# Patient Record
Sex: Male | Born: 1975 | Race: Black or African American | Hispanic: No | Marital: Married | Smoking: Never smoker
Health system: Southern US, Community
[De-identification: ages and names within clinical notes are randomized; demographics above are authoritative.]

## PROBLEM LIST (undated history)

## (undated) DIAGNOSIS — A150 Tuberculosis of lung: Secondary | ICD-10-CM

---

## 2013-12-24 ENCOUNTER — Ambulatory Visit
Admission: RE | Admit: 2013-12-24 | Discharge: 2013-12-24 | Disposition: A | Discharge status: Home / Self Care | Payer: No Typology Code available for payment source | Source: Ambulatory Visit | Attending: Infectious Disease | Admitting: Infectious Disease

## 2013-12-24 ENCOUNTER — Other Ambulatory Visit: Payer: Self-pay | Admitting: Infectious Disease

## 2013-12-24 DIAGNOSIS — R7611 Nonspecific reaction to tuberculin skin test without active tuberculosis: Secondary | ICD-10-CM

## 2014-02-01 ENCOUNTER — Emergency Department (HOSPITAL_COMMUNITY): Payer: No Typology Code available for payment source

## 2014-02-01 ENCOUNTER — Encounter (HOSPITAL_COMMUNITY): Payer: Self-pay | Admitting: Emergency Medicine

## 2014-02-01 ENCOUNTER — Emergency Department (HOSPITAL_COMMUNITY)
Admission: EM | Admit: 2014-02-01 | Discharge: 2014-02-01 | Disposition: A | Discharge status: Home / Self Care | Payer: No Typology Code available for payment source | Attending: Emergency Medicine | Admitting: Emergency Medicine

## 2014-02-01 DIAGNOSIS — Z8611 Personal history of tuberculosis: Secondary | ICD-10-CM | POA: Insufficient documentation

## 2014-02-01 DIAGNOSIS — Z79899 Other long term (current) drug therapy: Secondary | ICD-10-CM | POA: Insufficient documentation

## 2014-02-01 DIAGNOSIS — S161XXA Strain of muscle, fascia and tendon at neck level, initial encounter: Secondary | ICD-10-CM

## 2014-02-01 DIAGNOSIS — S139XXA Sprain of joints and ligaments of unspecified parts of neck, initial encounter: Secondary | ICD-10-CM | POA: Insufficient documentation

## 2014-02-01 DIAGNOSIS — Y92410 Unspecified street and highway as the place of occurrence of the external cause: Secondary | ICD-10-CM | POA: Insufficient documentation

## 2014-02-01 DIAGNOSIS — Y9389 Activity, other specified: Secondary | ICD-10-CM | POA: Insufficient documentation

## 2014-02-01 HISTORY — DX: Tuberculosis of lung: A15.0

## 2014-02-01 MED ORDER — CYCLOBENZAPRINE HCL 5 MG PO TABS: 5.0000 mg | ORAL_TABLET | Freq: Two times a day (BID) | ORAL | Status: DC | PRN

## 2014-02-01 MED ORDER — HYDROCODONE-ACETAMINOPHEN 5-325 MG PO TABS: 1.0000 | ORAL_TABLET | ORAL | Status: DC | PRN

## 2014-02-01 NOTE — Discharge Instructions (Signed)
Cervical Sprain A cervical sprain is an injury in the neck in which the strong, fibrous tissues (ligaments) that connect your neck bones stretch or tear. Cervical sprains can range from mild to severe. Severe cervical sprains can cause the neck vertebrae to be unstable. This can lead to damage of the spinal cord and can result in serious nervous system problems. The amount of time it takes for a cervical sprain to get better depends on the cause and extent of the injury. Most cervical sprains heal in 1 to 3 weeks. CAUSES  Severe cervical sprains may be caused by:   Contact sport injuries (such as from football, rugby, wrestling, hockey, auto racing, gymnastics, diving, martial arts, or boxing).   Motor vehicle collisions.   Whiplash injuries. This is an injury from a sudden forward-and backward whipping movement of the head and neck.  Falls.  Mild cervical sprains may be caused by:   Being in an awkward position, such as while cradling a telephone between your ear and shoulder.   Sitting in a chair that does not offer proper support.   Working at a poorly designed computer station.   Looking up or down for long periods of time.  SYMPTOMS   Pain, soreness, stiffness, or a burning sensation in the front, back, or sides of the neck. This discomfort may develop immediately after the injury or slowly, 24 hours or more after the injury.   Pain or tenderness directly in the middle of the back of the neck.   Shoulder or upper back pain.   Limited ability to move the neck.   Headache.   Dizziness.   Weakness, numbness, or tingling in the hands or arms.   Muscle spasms.   Difficulty swallowing or chewing.   Tenderness and swelling of the neck.  DIAGNOSIS  Most of the time your health care provider can diagnose a cervical sprain by taking your history and doing a physical exam. Your health care provider will ask about previous neck injuries and any known neck  problems, such as arthritis in the neck. X-rays may be taken to find out if there are any other problems, such as with the bones of the neck. Other tests, such as a CT scan or MRI, may also be needed.  TREATMENT  Treatment depends on the severity of the cervical sprain. Mild sprains can be treated with rest, keeping the neck in place (immobilization), and pain medicines. Severe cervical sprains are immediately immobilized. Further treatment is done to help with pain, muscle spasms, and other symptoms and may include:  Medicines, such as pain relievers, numbing medicines, or muscle relaxants.   Physical therapy. This may involve stretching exercises, strengthening exercises, and posture training. Exercises and improved posture can help stabilize the neck, strengthen muscles, and help stop symptoms from returning.  HOME CARE INSTRUCTIONS   Put ice on the injured area.   Put ice in a plastic bag.   Place a towel between your skin and the bag.   Leave the ice on for 15 20 minutes, 3 4 times a day.   If your injury was severe, you may have been given a cervical collar to wear. A cervical collar is a two-piece collar designed to keep your neck from moving while it heals.  Do not remove the collar unless instructed by your health care provider.  If you have long hair, keep it outside of the collar.  Ask your health care provider before making any adjustments to your collar.   Minor adjustments may be required over time to improve comfort and reduce pressure on your chin or on the back of your head.  Ifyou are allowed to remove the collar for cleaning or bathing, follow your health care provider's instructions on how to do so safely.  Keep your collar clean by wiping it with mild soap and water and drying it completely. If the collar you have been given includes removable pads, remove them every 1 2 days and hand wash them with soap and water. Allow them to air dry. They should be completely  dry before you wear them in the collar.  If you are allowed to remove the collar for cleaning and bathing, wash and dry the skin of your neck. Check your skin for irritation or sores. If you see any, tell your health care provider.  Do not drive while wearing the collar.   Only take over-the-counter or prescription medicines for pain, discomfort, or fever as directed by your health care provider.   Keep all follow-up appointments as directed by your health care provider.   Keep all physical therapy appointments as directed by your health care provider.   Make any needed adjustments to your workstation to promote good posture.   Avoid positions and activities that make your symptoms worse.   Warm up and stretch before being active to help prevent problems.  SEEK MEDICAL CARE IF:   Your pain is not controlled with medicine.   You are unable to decrease your pain medicine over time as planned.   Your activity level is not improving as expected.  SEEK IMMEDIATE MEDICAL CARE IF:   You develop any bleeding.  You develop stomach upset.  You have signs of an allergic reaction to your medicine.   Your symptoms get worse.   You develop new, unexplained symptoms.   You have numbness, tingling, weakness, or paralysis in any part of your body.  MAKE SURE YOU:   Understand these instructions.  Will watch your condition.  Will get help right away if you are not doing well or get worse. Document Released: 08/18/2007 Document Revised: 08/11/2013 Document Reviewed: 04/28/2013 [employer] Patient Information 2014 [employer].  
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
## 2014-02-04 NOTE — ED Provider Notes (Signed)
Medical screening examination/treatment/procedure(s) were performed by non-physician practitioner and as supervising physician I was immediately available for consultation/collaboration.   EKG Interpretation None        Lela Gell, MD 02/04/14 1600 

## 2014-10-04 ENCOUNTER — Ambulatory Visit (INDEPENDENT_AMBULATORY_CARE_PROVIDER_SITE_OTHER): Payer: 59 | Admitting: Medical

## 2014-10-04 VITALS — BP 100/70 | HR 60 | Temp 98.0°F | Resp 16 | Wt 149.0 lb

## 2014-10-04 DIAGNOSIS — R599 Enlarged lymph nodes, unspecified: Secondary | ICD-10-CM

## 2014-10-04 DIAGNOSIS — R590 Localized enlarged lymph nodes: Secondary | ICD-10-CM

## 2014-10-04 DIAGNOSIS — N481 Balanitis: Secondary | ICD-10-CM

## 2014-10-04 DIAGNOSIS — R21 Rash and other nonspecific skin eruption: Secondary | ICD-10-CM

## 2014-10-04 DIAGNOSIS — N489 Disorder of penis, unspecified: Secondary | ICD-10-CM

## 2014-10-04 MED ORDER — KETOCONAZOLE 2 % EX CREA: TOPICAL_CREAM | CUTANEOUS | Status: DC

## 2014-10-04 MED ORDER — HYDROCORTISONE 0.5 % EX CREA: TOPICAL_CREAM | CUTANEOUS | Status: DC

## 2014-10-04 MED ORDER — FLUCONAZOLE 100 MG PO TABS: 100.0000 mg | ORAL_TABLET | Freq: Every day | ORAL | Status: DC

## 2014-10-04 NOTE — Progress Notes (Signed)
Subjective: Here as a new patient today.  [place]  He has no significant health history.  [place] [facility] [place] [facility] [place] [facility] [place]  He notes having similar rash on the head of his penis couple times in the past including September 2013, was treated at that time with topical and oral fungal medicine that eventually resolved the rash.  The rash has been going on for the last few months, he went to the health department had reportedly negative STD screening, and was put on terconazole topical ointment which she says is not helping. He has been using this for over a month now. He denies any sweating or moisture in the genital region regularly.  He is circumcised.  He denies any urinary complaints, no testicular pain or lump, no penile discharge, no testicular pain or swelling, no abdominal or back pain related to this. 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 Denies history of STD, uses condoms. No other aggravating or relieving factors. No other complaint.  Review of systems as in subjective  Objective: Gen.: Well-developed, well-nourished, no acute distress There is several mildly swollen tender lymph nodes in the right inguinal region Dorsal glands of penis with several patches of pink red rash, flat, no vesicles, no other papules or other lesions, circumcised, no testicular mass or other lesion, no discharge, no other deformity Skin no other rash or lesions in the genital region   Assessment: Encounter Diagnoses  Name Primary?  . Balanitis Yes  . Rash of penis   . Inguinal lymphadenopathy    Plan: We'll request lab results from the health department but they were reportedly normal.  Given his prior success with oral plus topical agent, begin topical ketoconazole and low-dose hydrocortisone topical creams, begin Diflucan oral 1 week, discussed timeframe in which symptoms should  resolve. Advise recheck in 2 weeks. If not improving and that time consider dermatology referral or other baseline labs to rule out underlying process related to the poor healing

## 2015-04-08 ENCOUNTER — Other Ambulatory Visit: Payer: Self-pay | Admitting: Medical

## 2015-07-07 ENCOUNTER — Emergency Department (HOSPITAL_COMMUNITY)
Admission: EM | Admit: 2015-07-07 | Discharge: 2015-07-07 | Disposition: A | Discharge status: Home / Self Care | Payer: Self-pay | Attending: Emergency Medicine | Admitting: Emergency Medicine

## 2015-07-07 ENCOUNTER — Encounter (HOSPITAL_COMMUNITY): Payer: Self-pay | Admitting: *Deleted

## 2015-07-07 DIAGNOSIS — Y92410 Unspecified street and highway as the place of occurrence of the external cause: Secondary | ICD-10-CM | POA: Insufficient documentation

## 2015-07-07 DIAGNOSIS — Z7952 Long term (current) use of systemic steroids: Secondary | ICD-10-CM | POA: Insufficient documentation

## 2015-07-07 DIAGNOSIS — Z79899 Other long term (current) drug therapy: Secondary | ICD-10-CM | POA: Insufficient documentation

## 2015-07-07 DIAGNOSIS — S161XXA Strain of muscle, fascia and tendon at neck level, initial encounter: Secondary | ICD-10-CM | POA: Insufficient documentation

## 2015-07-07 DIAGNOSIS — Y9389 Activity, other specified: Secondary | ICD-10-CM | POA: Insufficient documentation

## 2015-07-07 DIAGNOSIS — Y999 Unspecified external cause status: Secondary | ICD-10-CM | POA: Insufficient documentation

## 2015-07-07 DIAGNOSIS — S299XXA Unspecified injury of thorax, initial encounter: Secondary | ICD-10-CM | POA: Insufficient documentation

## 2015-07-07 DIAGNOSIS — Z8611 Personal history of tuberculosis: Secondary | ICD-10-CM | POA: Insufficient documentation

## 2015-07-07 MED ORDER — METHOCARBAMOL 500 MG PO TABS: 1000.0000 mg | ORAL_TABLET | Freq: Four times a day (QID) | ORAL | Status: DC
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
## 2015-07-07 NOTE — ED Notes (Signed)
Pt stable, ambulatory, states understanding of discharge instructions 

## 2015-08-21 ENCOUNTER — Encounter (HOSPITAL_COMMUNITY): Payer: Self-pay | Admitting: Emergency Medicine

## 2015-08-21 ENCOUNTER — Emergency Department (HOSPITAL_COMMUNITY)
Admission: EM | Admit: 2015-08-21 | Discharge: 2015-08-21 | Disposition: A | Discharge status: Home / Self Care | Payer: Self-pay | Attending: Emergency Medicine | Admitting: Emergency Medicine

## 2015-08-21 DIAGNOSIS — Z791 Long term (current) use of non-steroidal anti-inflammatories (NSAID): Secondary | ICD-10-CM | POA: Insufficient documentation

## 2015-08-21 DIAGNOSIS — Z8611 Personal history of tuberculosis: Secondary | ICD-10-CM | POA: Insufficient documentation

## 2015-08-21 DIAGNOSIS — M542 Cervicalgia: Secondary | ICD-10-CM | POA: Insufficient documentation

## 2015-08-21 DIAGNOSIS — Z7952 Long term (current) use of systemic steroids: Secondary | ICD-10-CM | POA: Insufficient documentation

## 2015-08-21 DIAGNOSIS — Z79899 Other long term (current) drug therapy: Secondary | ICD-10-CM | POA: Insufficient documentation

## 2015-08-21 MED ORDER — IBUPROFEN 400 MG PO TABS: 800.0000 mg | ORAL_TABLET | Freq: Once | ORAL | Status: DC

## 2015-08-21 MED ORDER — IBUPROFEN 800 MG PO TABS: 800.0000 mg | ORAL_TABLET | Freq: Three times a day (TID) | ORAL | Status: DC
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
## 2015-08-21 NOTE — Discharge Instructions (Signed)
Cervical Strain and Sprain With Rehab Follow up with a primary care physician. Take ibuprofen for pain. Return for dizziness. Cervical strain and sprain are injuries that commonly occur with "whiplash" injuries. Whiplash occurs when the neck is forcefully whipped backward or forward, such as during a motor vehicle accident or during contact sports. The muscles, ligaments, tendons, discs, and nerves of the neck are susceptible to injury when this occurs. RISK FACTORS Risk of having a whiplash injury increases if:  Osteoarthritis of the spine.  Situations that make head or neck accidents or trauma more likely.  High-risk sports (football, rugby, wrestling, hockey, auto racing, gymnastics, diving, contact karate, or boxing).  Poor strength and flexibility of the neck.  Previous neck injury.  Poor tackling technique.  Improperly fitted or padded equipment. SYMPTOMS   Pain or stiffness in the front or back of neck or both.  Symptoms may present immediately or up to 24 hours after injury.  Dizziness, headache, nausea, and vomiting.  Muscle spasm with soreness and stiffness in the neck.  Tenderness and swelling at the injury site. PREVENTION  Learn and use proper technique (avoid tackling with the head, spearing, and head-butting; use proper falling techniques to avoid landing on the head).  Warm up and stretch properly before activity.  Maintain physical fitness:  Strength, flexibility, and endurance.  Cardiovascular fitness.  Wear properly fitted and padded protective equipment, such as padded soft collars, for participation in contact sports. PROGNOSIS  Recovery from cervical strain and sprain injuries is dependent on the extent of the injury. These injuries are usually curable in 1 week to 3 months with appropriate treatment.  RELATED COMPLICATIONS   Temporary numbness and weakness may occur if the nerve roots are damaged, and this may persist until the nerve has  completely healed.  Chronic pain due to frequent recurrence of symptoms.  Prolonged healing, especially if activity is resumed too soon (before complete recovery). TREATMENT  Treatment initially involves the use of ice and medication to help reduce pain and inflammation. It is also important to perform strengthening and stretching exercises and modify activities that worsen symptoms so the injury does not get worse. These exercises may be performed at home or with a therapist. For patients who experience severe symptoms, a soft, padded collar may be recommended to be worn around the neck.  Improving your posture may help reduce symptoms. Posture improvement includes pulling your chin and abdomen in while sitting or standing. If you are sitting, sit in a firm chair with your buttocks against the back of the chair. While sleeping, try replacing your pillow with a small towel rolled to 2 inches in diameter, or use a cervical pillow or soft cervical collar. Poor sleeping positions delay healing.  For patients with nerve root damage, which causes numbness or weakness, the use of a cervical traction apparatus may be recommended. Surgery is rarely necessary for these injuries. However, cervical strain and sprains that are present at birth (congenital) may require surgery. MEDICATION   If pain medication is necessary, nonsteroidal anti-inflammatory medications, such as aspirin and ibuprofen, or other minor pain relievers, such as acetaminophen, are often recommended.  Do not take pain medication for 7 days before surgery.  Prescription pain relievers may be given if deemed necessary by your caregiver. Use only as directed and only as much as you need. HEAT AND COLD:   Cold treatment (icing) relieves pain and reduces inflammation. Cold treatment should be applied for 10 to 15 minutes every 2 to  3 hours for inflammation and pain and immediately after any activity that aggravates your symptoms. Use ice packs  or an ice massage.  Heat treatment may be used prior to performing the stretching and strengthening activities prescribed by your caregiver, physical therapist, or athletic trainer. Use a heat pack or a warm soak. SEEK MEDICAL CARE IF:   Symptoms get worse or do not improve in 2 weeks despite treatment.  New, unexplained symptoms develop (drugs used in treatment may produce side effects). EXERCISES RANGE OF MOTION (ROM) AND STRETCHING EXERCISES - Cervical Strain and Sprain These exercises may help you when beginning to rehabilitate your injury. In order to successfully resolve your symptoms, you must improve your posture. These exercises are designed to help reduce the forward-head and rounded-shoulder posture which contributes to this condition. Your symptoms may resolve with or without further involvement from your physician, physical therapist or athletic trainer. While completing these exercises, remember:   Restoring tissue flexibility helps normal motion to return to the joints. This allows healthier, less painful movement and activity.  An effective stretch should be held for at least 20 seconds, although you may need to begin with shorter hold times for comfort.  A stretch should never be painful. You should only feel a gentle lengthening or release in the stretched tissue. STRETCH- Axial Extensors  Lie on your back on the floor. You may bend your knees for comfort. Place a rolled-up hand towel or dish towel, about 2 inches in diameter, under the part of your head that makes contact with the floor.  Gently tuck your chin, as if trying to make a "double chin," until you feel a gentle stretch at the base of your head.  Hold __________ seconds. Repeat __________ times. Complete this exercise __________ times per day.  STRETCH - Axial Extension   Stand or sit on a firm surface. Assume a good posture: chest up, shoulders drawn back, abdominal muscles slightly tense, knees unlocked (if  standing) and feet hip width apart.  Slowly retract your chin so your head slides back and your chin slightly lowers. Continue to look straight ahead.  You should feel a gentle stretch in the back of your head. Be certain not to feel an aggressive stretch since this can cause headaches later.  Hold for __________ seconds. Repeat __________ times. Complete this exercise __________ times per day. STRETCH - Cervical Side Bend   Stand or sit on a firm surface. Assume a good posture: chest up, shoulders drawn back, abdominal muscles slightly tense, knees unlocked (if standing) and feet hip width apart.  Without letting your nose or shoulders move, slowly tip your right / left ear to your shoulder until your feel a gentle stretch in the muscles on the opposite side of your neck.  Hold __________ seconds. Repeat __________ times. Complete this exercise __________ times per day. STRETCH - Cervical Rotators   Stand or sit on a firm surface. Assume a good posture: chest up, shoulders drawn back, abdominal muscles slightly tense, knees unlocked (if standing) and feet hip width apart.  Keeping your eyes level with the ground, slowly turn your head until you feel a gentle stretch along the back and opposite side of your neck.  Hold __________ seconds. Repeat __________ times. Complete this exercise __________ times per day. RANGE OF MOTION - Neck Circles   Stand or sit on a firm surface. Assume a good posture: chest up, shoulders drawn back, abdominal muscles slightly tense, knees unlocked (if standing) and feet  hip width apart.  Gently roll your head down and around from the back of one shoulder to the back of the other. The motion should never be forced or painful.  Repeat the motion 10-20 times, or until you feel the neck muscles relax and loosen. Repeat __________ times. Complete the exercise __________ times per day. STRENGTHENING EXERCISES - Cervical Strain and Sprain These exercises may  help you when beginning to rehabilitate your injury. They may resolve your symptoms with or without further involvement from your physician, physical therapist, or athletic trainer. While completing these exercises, remember:   Muscles can gain both the endurance and the strength needed for everyday activities through controlled exercises.  Complete these exercises as instructed by your physician, physical therapist, or athletic trainer. Progress the resistance and repetitions only as guided.  You may experience muscle soreness or fatigue, but the pain or discomfort you are trying to eliminate should never worsen during these exercises. If this pain does worsen, stop and make certain you are following the directions exactly. If the pain is still present after adjustments, discontinue the exercise until you can discuss the trouble with your clinician. STRENGTH - Cervical Flexors, Isometric  Face a wall, standing about 6 inches away. Place a small pillow, a ball about 6-8 inches in diameter, or a folded towel between your forehead and the wall.  Slightly tuck your chin and gently push your forehead into the soft object. Push only with mild to moderate intensity, building up tension gradually. Keep your jaw and forehead relaxed.  Hold 10 to 20 seconds. Keep your breathing relaxed.  Release the tension slowly. Relax your neck muscles completely before you start the next repetition. Repeat __________ times. Complete this exercise __________ times per day. STRENGTH- Cervical Lateral Flexors, Isometric   Stand about 6 inches away from a wall. Place a small pillow, a ball about 6-8 inches in diameter, or a folded towel between the side of your head and the wall.  Slightly tuck your chin and gently tilt your head into the soft object. Push only with mild to moderate intensity, building up tension gradually. Keep your jaw and forehead relaxed.  Hold 10 to 20 seconds. Keep your breathing  relaxed.  Release the tension slowly. Relax your neck muscles completely before you start the next repetition. Repeat __________ times. Complete this exercise __________ times per day. STRENGTH - Cervical Extensors, Isometric   Stand about 6 inches away from a wall. Place a small pillow, a ball about 6-8 inches in diameter, or a folded towel between the back of your head and the wall.  Slightly tuck your chin and gently tilt your head back into the soft object. Push only with mild to moderate intensity, building up tension gradually. Keep your jaw and forehead relaxed.  Hold 10 to 20 seconds. Keep your breathing relaxed.  Release the tension slowly. Relax your neck muscles completely before you start the next repetition. Repeat __________ times. Complete this exercise __________ times per day. POSTURE AND BODY MECHANICS CONSIDERATIONS - Cervical Strain and Sprain Keeping correct posture when sitting, standing or completing your activities will reduce the stress put on different body tissues, allowing injured tissues a chance to heal and limiting painful experiences. The following are general guidelines for improved posture. Your physician or physical therapist will provide you with any instructions specific to your needs. While reading these guidelines, remember:  The exercises prescribed by your provider will help you have the flexibility and strength to maintain correct  postures.  The correct posture provides the optimal environment for your joints to work. All of your joints have less wear and tear when properly supported by a spine with good posture. This means you will experience a healthier, less painful body.  Correct posture must be practiced with all of your activities, especially prolonged sitting and standing. Correct posture is as important when doing repetitive low-stress activities (typing) as it is when doing a single heavy-load activity (lifting). PROLONGED STANDING WHILE  SLIGHTLY LEANING FORWARD When completing a task that requires you to lean forward while standing in one place for a long time, place either foot up on a stationary 2- to 4-inch high object to help maintain the best posture. When both feet are on the ground, the low back tends to lose its slight inward curve. If this curve flattens (or becomes too large), then the back and your other joints will experience too much stress, fatigue more quickly, and can cause pain.  RESTING POSITIONS Consider which positions are most painful for you when choosing a resting position. If you have pain with flexion-based activities (sitting, bending, stooping, squatting), choose a position that allows you to rest in a less flexed posture. You would want to avoid curling into a fetal position on your side. If your pain worsens with extension-based activities (prolonged standing, working overhead), avoid resting in an extended position such as sleeping on your stomach. Most people will find more comfort when they rest with their spine in a more neutral position, neither too rounded nor too arched. Lying on a non-sagging bed on your side with a pillow between your knees, or on your back with a pillow under your knees will often provide some relief. Keep in mind, being in any one position for a prolonged period of time, no matter how correct your posture, can still lead to stiffness. WALKING Walk with an upright posture. Your ears, shoulders, and hips should all line up. OFFICE WORK When working at a desk, create an environment that supports good, upright posture. Without extra support, muscles fatigue and lead to excessive strain on joints and other tissues. CHAIR:  A chair should be able to slide under your desk when your back makes contact with the back of the chair. This allows you to work closely.  The chair's height should allow your eyes to be level with the upper part of your monitor and your hands to be slightly lower  than your elbows.  Body position:  Your feet should make contact with the floor. If this is not possible, use a foot rest.  Keep your ears over your shoulders. This will reduce stress on your neck and low back.   This information is not intended to replace advice given to you by your health care provider. Make sure you discuss any questions you have with your health care provider.   Document Released: 10/21/2005 Document Revised: 11/11/2014 Document Reviewed: 02/02/2009 Else[employer] Interactive Patient Education [employer]

## 2016-01-03 IMAGING — CR DG CHEST 1V
1 series · 1 of 1 positions shown · non-contrast
Comparison: None.

CLINICAL DATA: Positive tuberculin skin test

EXAM:
CHEST - 1 VIEW

[view not recorded]
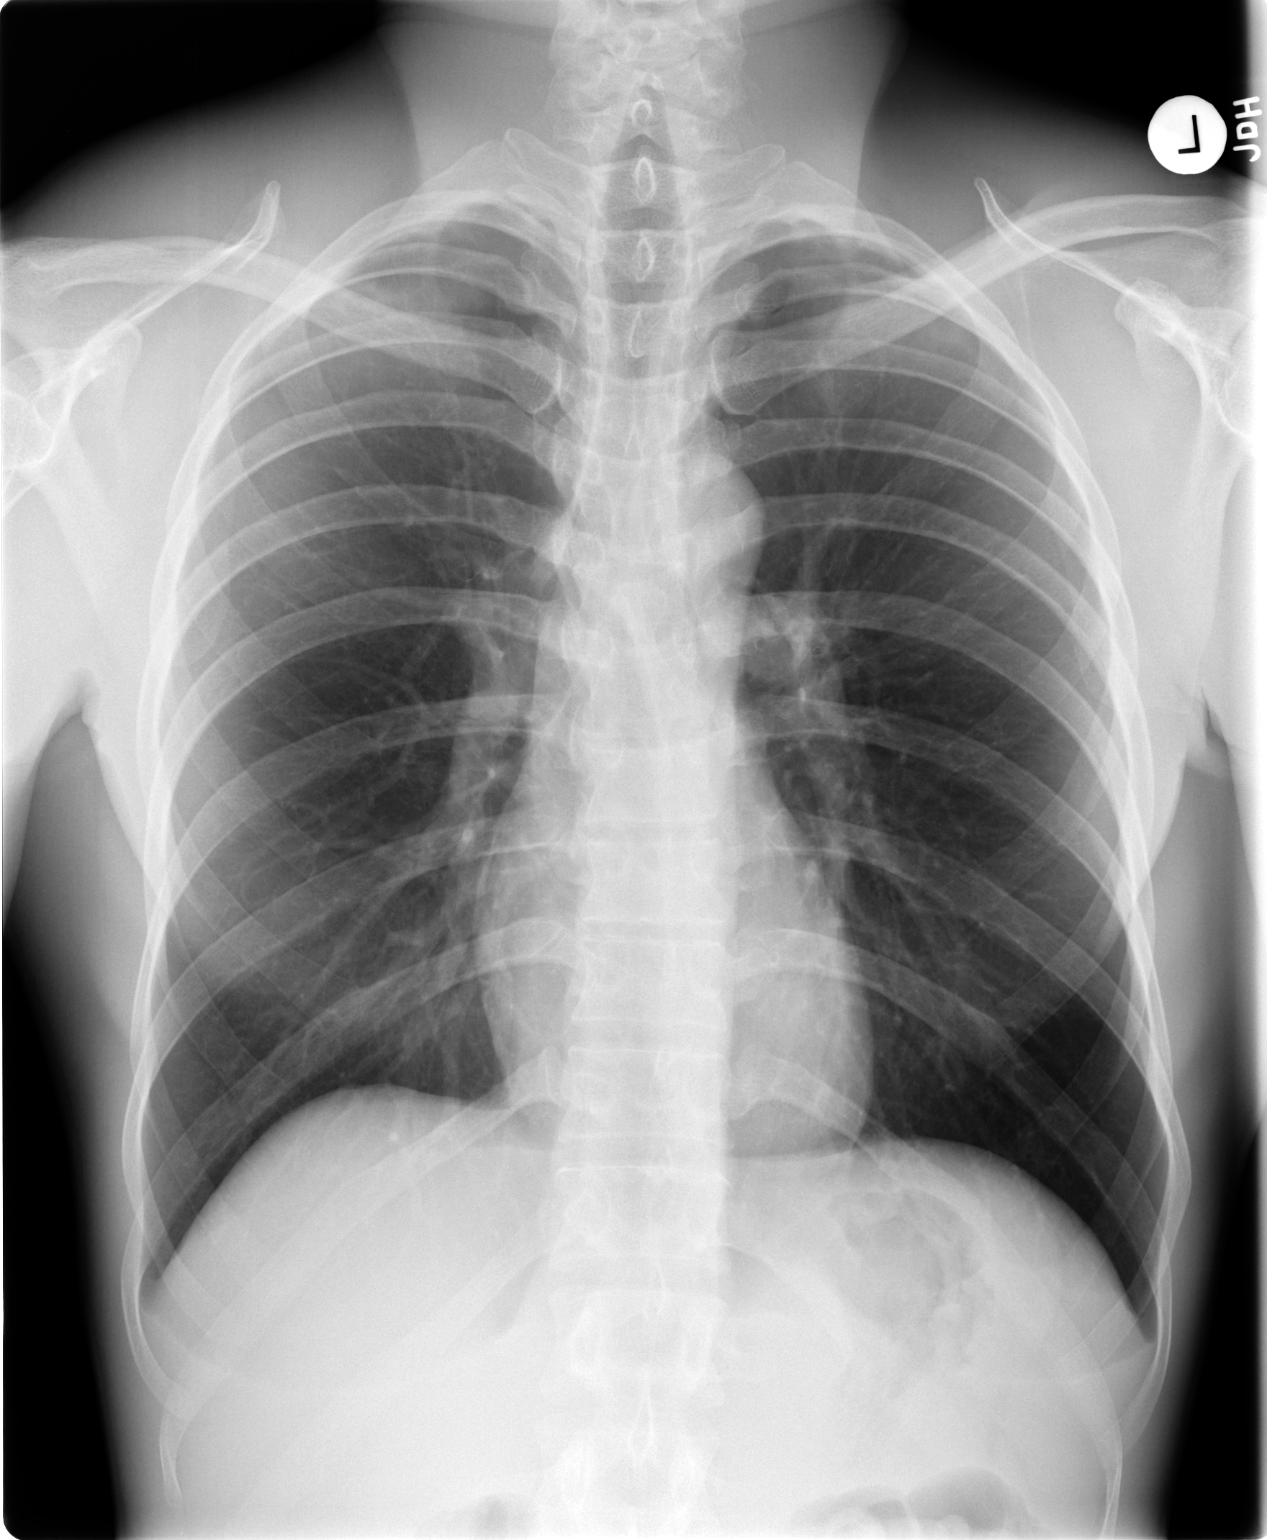

[1 of 1 positions shown; findings below may reference images not displayed]

FINDINGS: Lungs are clear. Heart size and pulmonary vascularity are normal. No
adenopathy. No bone lesions.
IMPRESSION: No abnormality noted.

## 2016-02-12 ENCOUNTER — Encounter (HOSPITAL_COMMUNITY): Payer: Self-pay | Admitting: Emergency Medicine

## 2016-02-12 ENCOUNTER — Emergency Department (HOSPITAL_COMMUNITY)
Admission: EM | Admit: 2016-02-12 | Discharge: 2016-02-12 | Disposition: A | Discharge status: Home / Self Care | Payer: Managed Care, Other (non HMO) | Attending: Emergency Medicine | Admitting: Emergency Medicine

## 2016-02-12 DIAGNOSIS — Z8611 Personal history of tuberculosis: Secondary | ICD-10-CM | POA: Diagnosis not present

## 2016-02-12 DIAGNOSIS — Z79899 Other long term (current) drug therapy: Secondary | ICD-10-CM | POA: Diagnosis not present

## 2016-02-12 DIAGNOSIS — L0231 Cutaneous abscess of buttock: Secondary | ICD-10-CM

## 2016-02-12 MED ORDER — LIDOCAINE-EPINEPHRINE (PF) 2 %-1:200000 IJ SOLN
10.0000 mL | Freq: Once | INTRAMUSCULAR | Status: AC
  Administered 2016-02-12: 10 mL
  Filled 2016-02-12: qty 20

## 2016-02-12 NOTE — Discharge Instructions (Signed)
Abscess °An abscess is an infected area that contains a collection of pus and debris. It can occur in almost any part of the body. An abscess is also known as a furuncle or boil. °CAUSES  °An abscess occurs when tissue gets infected. This can occur from blockage of oil or sweat glands, infection of hair follicles, or a minor injury to the skin. As the body tries to fight the infection, pus collects in the area and creates pressure under the skin. This pressure causes pain. People with weakened immune systems have difficulty fighting infections and get certain abscesses more often.  °SYMPTOMS °Usually an abscess develops on the skin and becomes a painful mass that is red, warm, and tender. If the abscess forms under the skin, you may feel a moveable soft area under the skin. Some abscesses break open (rupture) on their own, but most will continue to get worse without care. The infection can spread deeper into the body and eventually into the bloodstream, causing you to feel ill.  °DIAGNOSIS  °Your caregiver will take your medical history and perform a physical exam. A sample of fluid may also be taken from the abscess to determine what is causing your infection. °TREATMENT  °Your caregiver may prescribe antibiotic medicines to fight the infection. However, taking antibiotics alone usually does not cure an abscess. Your caregiver may need to make a small cut (incision) in the abscess to drain the pus. In some cases, gauze is packed into the abscess to reduce pain and to continue draining the area. °HOME CARE INSTRUCTIONS  °· Only take over-the-counter or prescription medicines for pain, discomfort, or fever as directed by your caregiver. °· If you were prescribed antibiotics, take them as directed. Finish them even if you start to feel better. °· If gauze is used, follow your caregiver's directions for changing the gauze. °· To avoid spreading the infection: °· Keep your draining abscess covered with a  bandage. °· Wash your hands well. °· Do not share personal care items, towels, or whirlpools with others. °· Avoid skin contact with others. °· Keep your skin and clothes clean around the abscess. °· Keep all follow-up appointments as directed by your caregiver. °SEEK MEDICAL CARE IF:  °· You have increased pain, swelling, redness, fluid drainage, or bleeding. °· You have muscle aches, chills, or a general ill feeling. °· You have a fever. °MAKE SURE YOU:  °· Understand these instructions. °· Will watch your condition. °· Will get help right away if you are not doing well or get worse. °  °This information is not intended to replace advice given to you by your health care provider. Make sure you discuss any questions you have with your health care provider. °  °Document Released: 07/31/2005 Document Revised: 04/21/2012 Document Reviewed: 01/03/2012 °Elsevier Interactive Patient Education ©2016 [employer] ° °Incision and Drainage °Incision and drainage is a procedure in which a sac-like structure (cystic structure) is opened and drained. The area to be drained usually contains material such as pus, fluid, or blood.  °LET YOUR CAREGIVER KNOW ABOUT:  °· Allergies to medicine. °· Medicines taken, including vitamins, herbs, eyedrops, over-the-counter medicines, and creams. °· Use of steroids (by mouth or creams). °· Previous problems with anesthetics or numbing medicines. °· History of bleeding problems or blood clots. °· Previous surgery. °· Other health problems, including diabetes and kidney problems. °· Possibility of pregnancy, if this applies. °RISKS AND COMPLICATIONS °· Pain. °· Bleeding. °· Scarring. °· Infection. °BEFORE THE PROCEDURE  °  You may need to have an ultrasound or other imaging tests to see how large or deep your cystic structure is. Blood tests may also be used to determine if you have an infection or how severe the infection is. You may need to have a tetanus shot. PROCEDURE  The affected area  is cleaned with a cleaning fluid. The cyst area will then be numbed with a medicine (local anesthetic). A small incision will be made in the cystic structure. A syringe or catheter may be used to drain the contents of the cystic structure, or the contents may be squeezed out. The area will then be flushed with a cleansing solution. After cleansing the area, it is often gently packed with a gauze or another wound dressing. Once it is packed, it will be covered with gauze and tape or some other type of wound dressing. AFTER THE PROCEDURE   Often, you will be allowed to go home right after the procedure.  You may be given antibiotic medicine to prevent or heal an infection.  If the area was packed with gauze or some other wound dressing, you will likely need to come back in 1 to 2 days to get it removed.  The area should heal in about 14 days.   This information is not intended to replace advice given to you by your health care provider. Make sure you discuss any questions you have with your health care provider.    Follow up with your PCP for a wound re-check in 3-4 days. Keep wound clean and dry. Return to the ED if you experience severe worsening of your symptoms, increased redness or swelling around wound, fevers, chills. Take ibuprofen as needed for pain.

## 2016-02-12 NOTE — ED Notes (Signed)
Patient states left buttock abscess that came up last week.   Denies any drainage.

## 2016-02-12 NOTE — ED Provider Notes (Signed)
CSN: [id number]     Arrival date & time 02/12/16  4098 History  By signing my name below, I, Ronney Lion, attest that this documentation has been prepared under the direction and in the presence of Chrystian Cupples, PA-C. Electronically Signed: Ronney Lion, ED Scribe. 02/12/2016. 11:27 AM.    Chief Complaint  Patient presents with  . Abscess   The history is provided by the patient. No language interpreter was used.    HPI Comments: Brian Hester is a 40 y.o. male who presents to the Emergency Department complaining of a gradual-onset, constant, worsening localized area of pain, redness, and swelling on his left buttock that he first noticed 1 week ago. He notes associated chills. Patient states he has had symptoms in the past attributed to an abscess, although the abscess was previously located on his groin. He states he is unable to sit secondary to his pain. He denies bowel or bladder incontinence or fever. NO painful bowel movement. No subjective fevers.   Past Medical History  Diagnosis Date  . TB (pulmonary tuberculosis)    History reviewed. No pertinent past surgical history. No family history on file. Social History  Substance Use Topics  . Smoking status: Never Smoker   . Smokeless tobacco: None  . Alcohol Use: Yes     Comment: socially    Review of Systems  Constitutional: Positive for chills. Negative for fever.  Skin:       Positive for localized area of pain, redness, and swelling on left buttock.   Allergies  Promethazine  Home Medications   Prior to Admission medications   Medication Sig Start Date End Date Taking? Authorizing Provider  fluconazole (DIFLUCAN) 100 MG tablet Take 1 tablet (100 mg total) by mouth daily. 10/04/14   Kermit Balo Tysinger, PA-C  hydrocortisone cream 0.5 % Apply to glans of penis once daily 10/04/14   Kermit Balo Tysinger, PA-C  ibuprofen (ADVIL,MOTRIN) 800 MG tablet Take 1 tablet (800 mg total) by mouth 3 (three) times daily. 08/21/15   Hanna  Patel-Mills, PA-C  ketoconazole (NIZORAL) 2 % cream Apply to penis once daily along with hydrocortisone cream 10/04/14   Kermit Balo Tysinger, PA-C  methocarbamol (ROBAXIN) 500 MG tablet Take 2 tablets (1,000 mg total) by mouth 4 (four) times daily. 07/07/15   Renne Crigler, PA-C  rifampin (RIFADIN) 300 MG capsule Take 300 mg by mouth 2 (two) times daily.    Historical Provider, MD   BP 129/75 mmHg  Pulse 66  Temp(Src) 98.1 F (36.7 C) (Oral)  Resp 16  Ht  (1.727 m)  Wt 138 lb (62.596 kg)  BMI 20.99 kg/m2  SpO2 100% Physical Exam  Constitutional: He is oriented to person, place, and time. He appears well-developed and well-nourished. No distress.  HENT:  Head: Normocephalic and atraumatic.  Eyes: Conjunctivae are normal. Right eye exhibits no discharge. Left eye exhibits no discharge. No scleral icterus.  Cardiovascular: Normal rate.   Pulmonary/Chest: Effort normal.  Neurological: He is alert and oriented to person, place, and time. Coordination normal.  Skin: Skin is warm and dry. No rash noted. He is not diaphoretic. No erythema. No pallor.  1.5 cm fluctuant mass on left gluteus. Mild surrounding erythema. No drainage or streaking. No perirectal induration.   Psychiatric: He has a normal mood and affect. His behavior is normal.  Nursing note and vitals reviewed.   ED Course  Procedures (including critical care time)  DIAGNOSTIC STUDIES: Oxygen Saturation is 100% on RA, normal by  my interpretation.    COORDINATION OF CARE: 10:44 AM - Discussed treatment plan with pt at bedside which includes I&D procedure. Pt verbalized understanding and agreed to plan.   INCISION AND DRAINAGE PROCEDURE NOTE: Patient identification was confirmed and verbal consent was obtained. This procedure was performed by Gaylyn RongSamantha Raea Magallon, PA-C, at 11:15 AM. Site: Left buttock Sterile procedures observed Needle size: 25 Anesthetic used (type and amt): Lidocaine 2% w/ epi, 3 mL  Blade size:  11 Drainage: Minimal  Complexity: Complex Site anesthetized, incision made over site, wound drained and explored loculations, rinsed with copious amounts of normal saline, wound covered with dry, sterile dressing.  Pt tolerated procedure well without complications.  Instructions for care discussed verbally and pt provided with additional written instructions for homecare and f/u.   MDM   Final diagnoses:  Gluteal abscess   Patient with skin abscess amenable to incision and drainage.  Abscess was not large enough to warrant packing. Encouraged home warm soaks and flushing.  Mild signs of cellulitis is surrounding skin.  Will d/c to home.  No antibiotic therapy is indicated.   I personally performed the services described in this documentation, which was scribed in my presence. The recorded information has been reviewed and is accurate.       Lester KinsmanSamantha Tripp [place], PA-C 02/14/16 [id number]  Richardean Canalavid H Yao, MD 02/14/16 1125

## 2017-04-22 ENCOUNTER — Encounter: Payer: Self-pay | Admitting: Internal Medicine

## 2017-04-22 ENCOUNTER — Ambulatory Visit (INDEPENDENT_AMBULATORY_CARE_PROVIDER_SITE_OTHER): Payer: BLUE CROSS/BLUE SHIELD | Admitting: Internal Medicine

## 2017-04-22 VITALS — BP 124/78 | HR 69 | Wt 153.2 lb

## 2017-04-22 DIAGNOSIS — M542 Cervicalgia: Secondary | ICD-10-CM

## 2017-04-22 DIAGNOSIS — R1030 Lower abdominal pain, unspecified: Secondary | ICD-10-CM | POA: Diagnosis not present

## 2017-04-22 DIAGNOSIS — M25511 Pain in right shoulder: Secondary | ICD-10-CM | POA: Diagnosis not present

## 2017-04-22 DIAGNOSIS — Z114 Encounter for screening for human immunodeficiency virus [HIV]: Secondary | ICD-10-CM

## 2017-04-22 DIAGNOSIS — Z0000 Encounter for general adult medical examination without abnormal findings: Secondary | ICD-10-CM | POA: Insufficient documentation

## 2017-04-22 MED ORDER — IBUPROFEN 600 MG PO TABS
600.0000 mg | ORAL_TABLET | Freq: Four times a day (QID) | ORAL | 0 refills | Status: DC | PRN
Start: 2017-04-22 — End: 2020-04-25

## 2017-04-22 MED ORDER — CYCLOBENZAPRINE HCL 7.5 MG PO TABS: 7.5000 mg | ORAL_TABLET | Freq: Three times a day (TID) | ORAL | 1 refills | Status: DC | PRN
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
## 2017-04-22 NOTE — Progress Notes (Signed)
   CC: Neck pain  HPI:  Brian Hester is a 41 y.o. man who is here to establish care with our clinic. He also has complaints of neck and sometimes groin pain that have been going on for the past approximately 3 months.   See problem based assessment and plan below for additional details  Past Medical History:  Diagnosis Date  . TB (pulmonary tuberculosis)     Review of Systems:  Review of Systems  Constitutional: Negative for chills, fever and weight loss.  HENT: Negative for sore throat.   Respiratory: Negative for shortness of breath.   Cardiovascular: Negative for chest pain and leg swelling.  Gastrointestinal: Negative for heartburn.  Musculoskeletal: Positive for neck pain. Negative for falls and joint pain.  Skin: Negative for rash.  Neurological: Positive for tingling. Negative for focal weakness.  Psychiatric/Behavioral: The patient does not have insomnia.     Physical Exam: Physical Exam  Constitutional: He is well-developed, well-nourished, and in no distress.  HENT:  Head: Normocephalic and atraumatic.  Mouth/Throat: Oropharynx is clear and moist. No oropharyngeal exudate.  Eyes: Conjunctivae are normal.  Neck: Normal range of motion. Neck supple.  Cardiovascular: Normal rate and regular rhythm.   Pulmonary/Chest: Effort normal and breath sounds normal.  Genitourinary:  Genitourinary Comments: Scrotum not swollen or tender, no masses, penis appears normal  Musculoskeletal: Normal range of motion. He exhibits no edema.  Lymphadenopathy:    He has no cervical adenopathy.  Neurological: He is alert. He has normal reflexes. Gait normal.  Skin: No rash noted.  Psychiatric: Affect normal.    Vitals:   04/22/17 0833  BP: 124/78  Pulse: 69  SpO2: 100%  Weight: 153 lb 3.2 oz (69.5 kg)    Assessment & Plan:   See Encounters Tab for problem based charting.  Patient discussed with Dr. Rogelia BogaButcher

## 2017-04-22 NOTE — Patient Instructions (Signed)
It was a pleasure to see you today Brian Hester.  I recommend trying the ibuprofen for inflammation and the flexeril, a muscle relaxant when you are having pain in your neck and shoulder.  If these are helpful but your symptoms continue, please call us back as needed for medication refills. If your symptoms become a bigger problem for you and do not improve please call us back to discuss this.  We will plan to see you next year for routine check up unless you need us for anything sooner.

## 2017-04-22 NOTE — Assessment & Plan Note (Signed)
HPI: He has some intermittent, sharp pain in his scrotum that does not seem to occur from any particular stimulus. There is no associated swelling or rash. He has never had problems with STIs in the past. He was once treated for balanitis in 2015 per char review but these are new symptoms and not similar to anything he can recall.  A: Intermittent sharp scrotal pain I do not appreciate any indirect inguinal hernia or testicular masses. The testes are nontender and not elevated. There is no rash or erythema present.  P: No specific intervention at this time If symptoms change or increase we can reassess

## 2017-04-22 NOTE — Assessment & Plan Note (Signed)
I will check some baseline laboratory studies today since noen are documented in our system. Including Bmet, CBC, HIV screening.

## 2017-04-22 NOTE — Progress Notes (Signed)
Internal Medicine Clinic Attending  Case discussed with Dr. Rice at the time of the visit.  We reviewed the resident's history and exam and pertinent patient test results.  I agree with the assessment, diagnosis, and plan of care documented in the resident's note.  

## 2017-04-23 ENCOUNTER — Other Ambulatory Visit (INDEPENDENT_AMBULATORY_CARE_PROVIDER_SITE_OTHER): Payer: BLUE CROSS/BLUE SHIELD

## 2017-04-23 DIAGNOSIS — Z114 Encounter for screening for human immunodeficiency virus [HIV]: Secondary | ICD-10-CM

## 2017-04-24 LAB — BMP8+ANION GAP
ANION GAP: 16 mmol/L (ref 10.0–18.0)
BUN / CREAT RATIO: 11 (ref 9–20)
BUN: 9 mg/dL (ref 6–20)
CO2: 23 mmol/L (ref 20–29)
CREATININE: 0.79 mg/dL (ref 0.76–1.27)
Calcium: 9.2 mg/dL (ref 8.7–10.2)
Chloride: 101 mmol/L (ref 96–106)
GFR calc Af Amer: 132 mL/min/{1.73_m2} (ref >59)
GFR calc non Af Amer: 114 mL/min/{1.73_m2} (ref >59)
Glucose: 89 mg/dL (ref 65–99)
Potassium: 4.3 mmol/L (ref 3.5–5.2)
Sodium: 140 mmol/L (ref 134–144)

## 2017-04-24 LAB — CBC
Hematocrit: 40.3 % (ref 37.5–51.0)
Hemoglobin: 13.4 g/dL (ref 13.0–17.7)
MCH: 28.6 pg (ref 26.6–33.0)
MCHC: 33.3 g/dL (ref 31.5–35.7)
MCV: 86 fL (ref 79–97)
Platelets: 252 10*3/uL (ref 150–379)
RBC: 4.69 x10E6/uL (ref 4.14–5.80)
RDW: 14.3 % (ref 12.3–15.4)
WBC: 3.1 10*3/uL — AB (ref 3.4–10.8)

## 2017-04-24 LAB — HIV ANTIBODY (ROUTINE TESTING W REFLEX): HIV Screen 4th Generation wRfx: NONREACTIVE

## 2019-01-12 ENCOUNTER — Other Ambulatory Visit: Payer: Self-pay

## 2019-01-12 ENCOUNTER — Emergency Department (HOSPITAL_COMMUNITY)
Admission: EM | Admit: 2019-01-12 | Discharge: 2019-01-12 | Disposition: A | Discharge status: Home / Self Care | Payer: 59 | Attending: Emergency Medicine | Admitting: Emergency Medicine

## 2019-01-12 ENCOUNTER — Encounter (HOSPITAL_COMMUNITY): Payer: Self-pay

## 2019-01-12 ENCOUNTER — Emergency Department (HOSPITAL_COMMUNITY): Payer: 59

## 2019-01-12 DIAGNOSIS — Z7982 Long term (current) use of aspirin: Secondary | ICD-10-CM | POA: Diagnosis not present

## 2019-01-12 DIAGNOSIS — R0789 Other chest pain: Secondary | ICD-10-CM | POA: Insufficient documentation

## 2019-01-12 DIAGNOSIS — I10 Essential (primary) hypertension: Secondary | ICD-10-CM

## 2019-01-12 DIAGNOSIS — R079 Chest pain, unspecified: Secondary | ICD-10-CM | POA: Diagnosis present

## 2019-01-12 LAB — BASIC METABOLIC PANEL
Anion gap: 9 (ref 5–15)
BUN: 12 mg/dL (ref 6–20)
CO2: 24 mmol/L (ref 22–32)
Calcium: 9.2 mg/dL (ref 8.9–10.3)
Chloride: 103 mmol/L (ref 98–111)
Creatinine, Ser: 0.97 mg/dL (ref 0.61–1.24)
GFR calc Af Amer: >60 mL/min (ref >60)
GFR calc non Af Amer: >60 mL/min (ref >60)
Glucose, Bld: 91 mg/dL (ref 70–99)
Potassium: 3.4 mmol/L — ABNORMAL LOW (ref 3.5–5.1)
Sodium: 136 mmol/L (ref 135–145)

## 2019-01-12 LAB — I-STAT TROPONIN, ED: Troponin i, poc: 0.01 ng/mL (ref 0.00–0.08)

## 2019-01-12 LAB — CBC
HCT: 39.7 % (ref 39.0–52.0)
HEMOGLOBIN: 13 g/dL (ref 13.0–17.0)
MCH: 28 pg (ref 26.0–34.0)
MCHC: 32.7 g/dL (ref 30.0–36.0)
MCV: 85.6 fL (ref 80.0–100.0)
PLATELETS: 215 10*3/uL (ref 150–400)
RBC: 4.64 MIL/uL (ref 4.22–5.81)
RDW: 12.5 % (ref 11.5–15.5)
WBC: 4.8 10*3/uL (ref 4.0–10.5)
nRBC: 0 % (ref 0.0–0.2)

## 2019-01-12 MED ORDER — NAPROXEN 500 MG PO TABS: 500.0000 mg | ORAL_TABLET | Freq: Two times a day (BID) | ORAL | 0 refills | Status: AC | PRN

## 2019-01-12 MED ORDER — LISINOPRIL 5 MG PO TABS: 5.0000 mg | ORAL_TABLET | Freq: Every day | ORAL | 0 refills | Status: DC

## 2019-01-12 MED ORDER — SODIUM CHLORIDE 0.9% FLUSH: 3.0000 mL | Freq: Once | INTRAVENOUS | Status: DC

## 2019-01-12 NOTE — ED Notes (Signed)
Patient verbalizes understanding of discharge instructions. Opportunity for questioning and answers were provided. Armband removed by staff, pt discharged from ED.  

## 2019-01-12 NOTE — ED Triage Notes (Signed)
Pt states he was helping a friend lift boxes and states he has had pain ever since. Took 2 81mg  ASA and 2 tylenols this morning without relief of pain. Pt a.o, no other symptoms.

## 2019-01-12 NOTE — ED Provider Notes (Signed)
[facility] EMERGENCY DEPARTMENT Provider Note   CSN: [id number] Arrival date & time: 01/12/19  1524    History   Chief Complaint Chief Complaint  Patient presents with  . Chest Pain    HPI Brian Hester is a 43 y.o. male.     Pt presents to the ED complaining of sudden onset, constant, sharp, 10/10 left sided chest pain that occurred while lifting heavy boxes today. Pt took ibuprofen without relief, prompting him to come to the ED tonight. 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 No hx of MI in the past. No previous stress test or cath. Pt does not go to the doctor regularly for check ups. Denies shortness of breath, nausea, vomiting, diaphoresis, radiation of pain, or any other associated symptoms.      Past Medical History:  Diagnosis Date  . TB (pulmonary tuberculosis)     Patient Active Problem List   Diagnosis Date Noted  . Encounter for medical examination to establish care 04/22/2017  . Neck pain on right side 04/22/2017  . Groin pain 04/22/2017  . Screening for HIV without presence of risk factors 04/22/2017    History reviewed. No pertinent surgical history.      Home Medications    Prior to Admission medications   Medication Sig Start Date End Date Taking? Authorizing Provider  acetaminophen (TYLENOL) 500 MG tablet Take 1,000 mg by mouth every 6 (six) hours as needed for mild pain.   Yes [provider]  aspirin 81 MG tablet Take 162 mg by mouth daily.   Yes [provider]  cyclobenzaprine (FEXMID) 7.5 MG tablet Take 1 tablet (7.5 mg total) by mouth 3 (three) times daily as needed for muscle spasms. Patient not taking: Reported on 01/12/2019 04/22/17   Fuller Plan, MD  ibuprofen (ADVIL,MOTRIN) 600 MG tablet Take 1 tablet (600 mg total) by mouth every 6 (six) hours as needed for moderate pain or cramping. Patient not  taking: Reported on 01/12/2019 04/22/17   Fuller Plan, MD  lisinopril (PRINIVIL,ZESTRIL) 5 MG tablet Take 1 tablet (5 mg total) by mouth daily for 10 days. 01/12/19 01/22/19  Hyman Hopes, Shaelin Lalley, PA-C  naproxen (NAPROSYN) 500 MG tablet Take 1 tablet (500 mg total) by mouth 2 (two) times daily as needed for up to 10 days for moderate pain. 01/12/19 01/22/19  Tanda Rockers, PA-C    Family History Family History  Problem Relation Age of Onset  . Diabetes Neg Hx   . Cancer Neg Hx     Social History Social History   Tobacco Use  . Smoking status: Never Smoker  . Smokeless tobacco: Never Used  Substance Use Topics  . Alcohol use: Yes    Comment: socially  . Drug use: No     Allergies   Promethazine   Review of Systems Review of Systems  Constitutional: Negative for chills and fever.  Eyes: Negative for visual disturbance.  Respiratory: Negative for cough and shortness of breath.   Cardiovascular: Positive for chest pain.  Gastrointestinal: Negative for nausea and vomiting.     Physical Exam Updated Vital Signs BP (!) 149/103   Pulse (!) 54   Temp 98.7 F (37.1 C) (Oral)   Resp 13   Ht  (1.727 m)   Wt 72.6 kg   SpO2 100%   BMI 24.33 kg/m   Physical Exam Vitals signs  and nursing note reviewed.  Constitutional:      Appearance: He is not ill-appearing.  HENT:     Head: Normocephalic and atraumatic.  Eyes:     Conjunctiva/sclera: Conjunctivae normal.  Neck:     Musculoskeletal: Neck supple.  Cardiovascular:     Rate and Rhythm: Normal rate and regular rhythm.     Heart sounds: Normal heart sounds.  Pulmonary:     Effort: Pulmonary effort is normal.     Breath sounds: Normal breath sounds. No decreased breath sounds, wheezing, rhonchi or rales.  Chest:     Comments: TTP over anterior left chest wall Abdominal:     Palpations: Abdomen is soft.     Tenderness: There is no abdominal tenderness.  Skin:    General: Skin is warm and dry.  Neurological:       Mental Status: He is alert.      ED Treatments / Results  Labs (all labs ordered are listed, but only abnormal results are displayed) Labs Reviewed  BASIC METABOLIC PANEL - Abnormal; Notable for the following components:      Result Value   Potassium 3.4 (*)    All other components within normal limits  CBC  I-STAT TROPONIN, ED    EKG EKG Interpretation  Date/Time:  Tuesday January 12 2019 15:32:44 EDT Ventricular Rate:  75 PR Interval:  174 QRS Duration: 82 QT Interval:  366 QTC Calculation: 408 R Axis:   48 Text Interpretation:  Normal sinus rhythm Right atrial enlargement Borderline ECG No significant change since last tracing Confirmed by Melene Plan [phone]) on 01/12/2019 3:38:37 PM   Radiology Dg Chest 2 View  Result Date: 01/12/2019 CLINICAL DATA:  Chest pain after heavy lifting EXAM: CHEST - 2 VIEW COMPARISON:  December 24, 2013 FINDINGS: The lungs are clear. Heart size and pulmonary vascularity are normal. No adenopathy. No pneumothorax. No bone lesions. IMPRESSION: No edema or consolidation. Electronically Signed   By: Bretta Bang III M.D.   On: 01/12/2019 16:16    Procedures Procedures (including critical care time)  Medications Ordered in ED Medications  sodium chloride flush (NS) 0.9 % injection 3 mL (has no administration in time range)     Initial Impression / Assessment and Plan / ED Course  I have reviewed the triage vital signs and the nursing notes.  Pertinent labs & imaging results that were available during my care of the patient were reviewed by me and considered in my medical decision making (see chart for details).    Pt presents for sudden onset left sided chest pain while moving heavy boxes today. No other ACS like symptoms. Pt reports similar pain in the past while lifting heavy objects. He is an avid runner and runs about 30 minutes each day without any pain. He also does weight lifting everyday without issues. Low suspcision for  ACS. Heart score 1. ACS workup initiated in the ED in triage; troponin negative. EKG normal. CXR normal. CBC and BMP wnl. Pt mildly hypertensive in the ED; does not see a doctor regularly. Will discharge home with 5 mg Lisinopril x 10 days. Pt to follow up with [facility] Physician to establish care. Pt in agreement with plan. Vital signs stable for discharge.         Final Clinical Impressions(s) / ED Diagnoses   Final diagnoses:  Other chest pain  Chest wall pain  Essential hypertension    ED Discharge Orders         Ordered  lisinopril (PRINIVIL,ZESTRIL) 5 MG tablet  Daily     01/12/19 2134    naproxen (NAPROSYN) 500 MG tablet  2 times daily PRN     01/12/19 2134           Tanda Rockers, PA-C 01/12/19 2324    Eber Hong, MD 01/13/19 [phone]

## 2019-01-12 NOTE — Discharge Instructions (Addendum)
Please take medication as prescribed for your high blood pressure You can take Naproxen as needed for pain Please call [facility] Medicine tomorrow to establish care with a provider there Return to the ED for any worsening pain, shortness of breath, nausea, vomiting, or if you begin to feel clammy

## 2019-01-12 NOTE — ED Provider Notes (Signed)
Medical screening examination/treatment/procedure(s) were conducted as a shared visit with non-physician practitioner(s) and myself.  I personally evaluated the patient during the encounter.  Clinical Impression:   Final diagnoses:  Other chest pain  Chest wall pain  Essential hypertension     EKG Interpretation  Date/Time:  Tuesday January 12 2019 15:32:44 EDT Ventricular Rate:  75 PR Interval:  174 QRS Duration: 82 QT Interval:  366 QTC Calculation: 408 R Axis:   48 Text Interpretation:  Normal sinus rhythm Right atrial enlargement Borderline ECG No significant change since last tracing Confirmed by Melene Plan [phone]) on 01/12/2019 3:38:37 PM        43 year old male, otherwise healthy, presents with a complaint of chest discomfort.  He has no coughing or shortness of breath, no radiation of the pain, this occurred after lifting something heavy.  On exam he has clear heart and lung sounds, no reproducible tenderness, EKG is unremarkable, the patient is well-appearing and low risk for acute coronary syndrome, stable for discharge on an anti-inflammatory.   Eber Hong, MD 01/13/19 [phone]

## 2019-10-25 ENCOUNTER — Ambulatory Visit (HOSPITAL_COMMUNITY)
Admission: EM | Admit: 2019-10-25 | Discharge: 2019-10-25 | Disposition: A | Discharge status: Home / Self Care | Payer: 59 | Attending: Family Medicine | Admitting: Family Medicine

## 2019-10-25 ENCOUNTER — Encounter (HOSPITAL_COMMUNITY): Payer: Self-pay

## 2019-10-25 DIAGNOSIS — Z20822 Contact with and (suspected) exposure to covid-19: Secondary | ICD-10-CM

## 2019-10-25 DIAGNOSIS — Z20828 Contact with and (suspected) exposure to other viral communicable diseases: Secondary | ICD-10-CM | POA: Diagnosis not present

## 2019-10-25 NOTE — ED Triage Notes (Signed)
Pt is here for a covid test and work note. Pt denies any symptoms for covid 19.

## 2019-10-25 NOTE — Discharge Instructions (Signed)
Person Under Monitoring Name: Brian Hester  Location: [address] [place] [address]   Infection Prevention Recommendations for Individuals Confirmed to have, or Being Evaluated for, 2019 Novel Coronavirus (COVID-19) Infection Who Receive Care at Home  Individuals who are confirmed to have, or are being evaluated for, COVID-19 should follow the prevention steps below until a healthcare provider or local or state health department says they can return to normal activities.  Stay home except to get medical care You should restrict activities outside your home, except for getting medical care. Do not go to work, school, or public areas, and do not use public transportation or taxis.  Call ahead before visiting your doctor Before your medical appointment, call the healthcare provider and tell them that you have, or are being evaluated for, COVID-19 infection. This will help the healthcare provider's office take steps to keep other people from getting infected. Ask your healthcare provider to call the local or state health department.  Monitor your symptoms Seek prompt medical attention if your illness is worsening (e.g., difficulty breathing). Before going to your medical appointment, call the healthcare provider and tell them that you have, or are being evaluated for, COVID-19 infection. Ask your healthcare provider to call the local or state health department.  Wear a facemask You should wear a facemask that covers your nose and mouth when you are in the same room with other people and when you visit a healthcare provider. People who live with or visit you should also wear a facemask while they are in the same room with you.  Separate yourself from other people in your home As much as possible, you should stay in a different room from other people in your home. Also, you should use a separate bathroom, if available.  Avoid sharing household items You should not  share dishes, drinking glasses, cups, eating utensils, towels, bedding, or other items with other people in your home. After using these items, you should wash them thoroughly with soap and water.  Cover your coughs and sneezes Cover your mouth and nose with a tissue when you cough or sneeze, or you can cough or sneeze into your sleeve. Throw used tissues in a lined trash can, and immediately wash your hands with soap and water for at least 20 seconds or use an alcohol-based hand rub.  Wash your [employer] your hands often and thoroughly with soap and water for at least 20 seconds. You can use an alcohol-based hand sanitizer if soap and water are not available and if your hands are not visibly dirty. Avoid touching your eyes, nose, and mouth with unwashed hands.   Prevention Steps for Caregivers and Household Members of Individuals Confirmed to have, or Being Evaluated for, COVID-19 Infection Being Cared for in the Home  If you live with, or provide care at home for, a person confirmed to have, or being evaluated for, COVID-19 infection please follow these guidelines to prevent infection:  Follow healthcare provider's instructions Make sure that you understand and can help the patient follow any healthcare provider instructions for all care.  Provide for the patient's basic needs You should help the patient with basic needs in the home and provide support for getting groceries, prescriptions, and other personal needs.  Monitor the patient's symptoms If they are getting sicker, call his or her medical provider and tell them that the patient has, or is being evaluated for, COVID-19 infection. This will help the healthcare provider's office take  steps to keep other people from getting infected. Ask the healthcare provider to call the local or state health department.  Limit the number of people who have contact with the patient If possible, have only one caregiver for the  patient. Other household members should stay in another home or place of residence. If this is not possible, they should stay in another room, or be separated from the patient as much as possible. Use a separate bathroom, if available. Restrict visitors who do not have an essential need to be in the home.  Keep older adults, very young children, and other sick people away from the patient Keep older adults, very young children, and those who have compromised immune systems or chronic health conditions away from the patient. This includes people with chronic heart, lung, or kidney conditions, diabetes, and cancer.  Ensure good ventilation Make sure that shared spaces in the home have good air flow, such as from an air conditioner or an opened window, weather permitting.  Wash your hands often Wash your hands often and thoroughly with soap and water for at least 20 seconds. You can use an alcohol based hand sanitizer if soap and water are not available and if your hands are not visibly dirty. Avoid touching your eyes, nose, and mouth with unwashed hands. Use disposable paper towels to dry your hands. If not available, use dedicated cloth towels and replace them when they become wet.  Wear a facemask and gloves Wear a disposable facemask at all times in the room and gloves when you touch or have contact with the patient's blood, body fluids, and/or secretions or excretions, such as sweat, saliva, sputum, nasal mucus, vomit, urine, or feces.  Ensure the mask fits over your nose and mouth tightly, and do not touch it during use. Throw out disposable facemasks and gloves after using them. Do not reuse. Wash your hands immediately after removing your facemask and gloves. If your personal clothing becomes contaminated, carefully remove clothing and launder. Wash your hands after handling contaminated clothing. Place all used disposable facemasks, gloves, and other waste in a lined container before  disposing them with other household waste. Remove gloves and wash your hands immediately after handling these items.  Do not share dishes, glasses, or other household items with the patient Avoid sharing household items. You should not share dishes, drinking glasses, cups, eating utensils, towels, bedding, or other items with a patient who is confirmed to have, or being evaluated for, COVID-19 infection. After the person uses these items, you should wash them thoroughly with soap and water.  Wash laundry thoroughly Immediately remove and wash clothes or bedding that have blood, body fluids, and/or secretions or excretions, such as sweat, saliva, sputum, nasal mucus, vomit, urine, or feces, on them. Wear gloves when handling laundry from the patient. Read and follow directions on labels of laundry or clothing items and detergent. In general, wash and dry with the warmest temperatures recommended on the label.  Clean all areas the individual has used often Clean all touchable surfaces, such as counters, tabletops, doorknobs, bathroom fixtures, toilets, phones, keyboards, tablets, and bedside tables, every day. Also, clean any surfaces that may have blood, body fluids, and/or secretions or excretions on them. Wear gloves when cleaning surfaces the patient has come in contact with. Use a diluted bleach solution (e.g., dilute bleach with 1 part bleach and 10 parts water) or a household disinfectant with a label that says EPA-registered for coronaviruses. To make a bleach solution  at home, add 1 tablespoon of bleach to 1 quart (4 cups) of water. For a larger supply, add  cup of bleach to 1 gallon (16 cups) of water. Read labels of cleaning products and follow recommendations provided on product labels. Labels contain instructions for safe and effective use of the cleaning product including precautions you should take when applying the product, such as wearing gloves or eye protection and making sure you  have good ventilation during use of the product. Remove gloves and wash hands immediately after cleaning.  Monitor yourself for signs and symptoms of illness Caregivers and household members are considered close contacts, should monitor their health, and will be asked to limit movement outside of the home to the extent possible. Follow the monitoring steps for close contacts listed on the symptom monitoring form.   ? If you have additional questions, contact your local health department or call the epidemiologist on call at [phone] (available 24/7). ? This guidance is subject to change. For the most up-to-date guidance from [facility], please refer to their website: [email or web address]

## 2019-10-25 NOTE — ED Provider Notes (Signed)
[facility]    CSN: [id number] Arrival date & time: 10/25/19  1324      History   Chief Complaint Chief Complaint  Patient presents with  . Covid 19    HPI Brian Hester is a 43 y.o. male history of TB presenting today for Covid testing.  Patient states that on Saturday at work he was noted to have an elevated temperature greater than 100.  He is unsure exactly what his temperature measured.  States that at the time he felt warm.  Since he has not had any fevers or felt any other symptoms concerning for Covid.  Denies any congestion, cough or sore throat.  Denies loss of smell or taste.  Denies chills or body aches.  Denies any known exposure to Covid at home or at work.  Requiring testing in order to return to work.  HPI  Past Medical History:  Diagnosis Date  . TB (pulmonary tuberculosis)     Patient Active Problem List   Diagnosis Date Noted  . Encounter for medical examination to establish care 04/22/2017  . Neck pain on right side 04/22/2017  . Groin pain 04/22/2017  . Screening for HIV without presence of risk factors 04/22/2017    History reviewed. No pertinent surgical history.     Home Medications    Prior to Admission medications   Medication Sig Start Date End Date Taking? Authorizing Provider  acetaminophen (TYLENOL) 500 MG tablet Take 1,000 mg by mouth every 6 (six) hours as needed for mild pain.    [provider]  aspirin 81 MG tablet Take 162 mg by mouth daily.    [provider]  cyclobenzaprine (FEXMID) 7.5 MG tablet Take 1 tablet (7.5 mg total) by mouth 3 (three) times daily as needed for muscle spasms. Patient not taking: Reported on 01/12/2019 04/22/17   Fuller Plan, MD  ibuprofen (ADVIL,MOTRIN) 600 MG tablet Take 1 tablet (600 mg total) by mouth every 6 (six) hours as needed for moderate pain or cramping. Patient not taking: Reported on 01/12/2019 04/22/17   Fuller Plan, MD  lisinopril (PRINIVIL,ZESTRIL)  5 MG tablet Take 1 tablet (5 mg total) by mouth daily for 10 days. 01/12/19 01/22/19  Tanda Rockers, PA-C    Family History Family History  Problem Relation Age of Onset  . Diabetes Neg Hx   . Cancer Neg Hx     Social History Social History   Tobacco Use  . Smoking status: Never Smoker  . Smokeless tobacco: Never Used  Substance Use Topics  . Alcohol use: Yes    Comment: socially  . Drug use: No     Allergies   Promethazine   Review of Systems Review of Systems  Constitutional: Positive for fever. Negative for activity change, appetite change, chills and fatigue.  HENT: Negative for congestion, ear pain, rhinorrhea, sinus pressure, sore throat and trouble swallowing.   Eyes: Negative for discharge and redness.  Respiratory: Negative for cough, chest tightness and shortness of breath.   Cardiovascular: Negative for chest pain.  Gastrointestinal: Negative for abdominal pain, diarrhea, nausea and vomiting.  Musculoskeletal: Negative for myalgias.  Skin: Negative for rash.  Neurological: Negative for dizziness, light-headedness and headaches.     Physical Exam Triage Vital Signs ED Triage Vitals  Enc Vitals Group     BP 10/25/19 1449 124/78     Pulse Rate 10/25/19 1449 67     Resp 10/25/19 1449 16     Temp 10/25/19 1449 98.3  F (36.8 C)     Temp Source 10/25/19 1449 Oral     SpO2 10/25/19 1449 99 %     Weight --      Height --      Head Circumference --      Peak Flow --      Pain Score 10/25/19 1450 0     Pain Loc --      Pain Edu? --      Excl. in [place]? --    No data found.  Updated Vital Signs BP 124/78 (BP Location: Left Arm)   Pulse 67   Temp 98.3 F (36.8 C) (Oral)   Resp 16   SpO2 99%   Visual Acuity Right Eye Distance:   Left Eye Distance:   Bilateral Distance:    Right Eye Near:   Left Eye Near:    Bilateral Near:     Physical Exam Vitals and nursing note reviewed.  Constitutional:      Appearance: He is well-developed.      Comments: No acute distress  HENT:     Head: Normocephalic and atraumatic.     Nose: Nose normal.  Eyes:     Conjunctiva/sclera: Conjunctivae normal.  Cardiovascular:     Rate and Rhythm: Normal rate.  Pulmonary:     Effort: Pulmonary effort is normal. No respiratory distress.     Comments: Breathing comfortably at rest, CTABL, no wheezing, rales or other adventitious sounds auscultated Abdominal:     General: There is no distension.  Musculoskeletal:        General: Normal range of motion.     Cervical back: Neck supple.  Skin:    General: Skin is warm and dry.  Neurological:     Mental Status: He is alert and oriented to person, place, and time.      UC Treatments / Results  Labs (all labs ordered are listed, but only abnormal results are displayed) Labs Reviewed  NOVEL CORONAVIRUS, NAA (HOSP ORDER, SEND-OUT TO REF LAB; TAT 18-24 HRS)    EKG   Radiology No results found.  Procedures Procedures (including critical care time)  Medications Ordered in UC Medications - No data to display  Initial Impression / Assessment and Plan / UC Course  I have reviewed the triage vital signs and the nursing notes.  Pertinent labs & imaging results that were available during my care of the patient were reviewed by me and considered in my medical decision making (see chart for details).     Covid PCR pending, quarantine until results return.  Discussed symptomatic and supportive care.  Vital signs stable, relatively asymptomatic at this time. Monitor for development of other symptoms. Discussed strict return precautions. Patient verbalized understanding and is agreeable with plan.  Final Clinical Impressions(s) / UC Diagnoses   Final diagnoses:  Encounter for laboratory testing for COVID-19 virus     Discharge Instructions        Person Under Monitoring Name: Brian Hester  Location: [address] [place] [address]   Infection Prevention Recommendations  for Individuals Confirmed to have, or Being Evaluated for, 2019 Novel Coronavirus (COVID-19) Infection Who Receive Care at Home  Individuals who are confirmed to have, or are being evaluated for, COVID-19 should follow the prevention steps below until a healthcare provider or local or state health department says they can return to normal activities.  Stay home except to get medical care You should restrict activities outside your home, except for getting medical  care. Do not go to work, school, or public areas, and do not use public transportation or taxis.  Call ahead before visiting your doctor Before your medical appointment, call the healthcare provider and tell them that you have, or are being evaluated for, COVID-19 infection. This will help the healthcare provider's office take steps to keep other people from getting infected. Ask your healthcare provider to call the local or state health department.  Monitor your symptoms Seek prompt medical attention if your illness is worsening (e.g., difficulty breathing). Before going to your medical appointment, call the healthcare provider and tell them that you have, or are being evaluated for, COVID-19 infection. Ask your healthcare provider to call the local or state health department.  Wear a facemask You should wear a facemask that covers your nose and mouth when you are in the same room with other people and when you visit a healthcare provider. People who live with or visit you should also wear a facemask while they are in the same room with you.  Separate yourself from other people in your home As much as possible, you should stay in a different room from other people in your home. Also, you should use a separate bathroom, if available.  Avoid sharing household items You should not share dishes, drinking glasses, cups, eating utensils, towels, bedding, or other items with other people in your home. After using these items, you  should wash them thoroughly with soap and water.  Cover your coughs and sneezes Cover your mouth and nose with a tissue when you cough or sneeze, or you can cough or sneeze into your sleeve. Throw used tissues in a lined trash can, and immediately wash your hands with soap and water for at least 20 seconds or use an alcohol-based hand rub.  Wash your [employer] your hands often and thoroughly with soap and water for at least 20 seconds. You can use an alcohol-based hand sanitizer if soap and water are not available and if your hands are not visibly dirty. Avoid touching your eyes, nose, and mouth with unwashed hands.   Prevention Steps for Caregivers and Household Members of Individuals Confirmed to have, or Being Evaluated for, COVID-19 Infection Being Cared for in the Home  If you live with, or provide care at home for, a person confirmed to have, or being evaluated for, COVID-19 infection please follow these guidelines to prevent infection:  Follow healthcare provider's instructions Make sure that you understand and can help the patient follow any healthcare provider instructions for all care.  Provide for the patient's basic needs You should help the patient with basic needs in the home and provide support for getting groceries, prescriptions, and other personal needs.  Monitor the patient's symptoms If they are getting sicker, call his or her medical provider and tell them that the patient has, or is being evaluated for, COVID-19 infection. This will help the healthcare provider's office take steps to keep other people from getting infected. Ask the healthcare provider to call the local or state health department.  Limit the number of people who have contact with the patient  If possible, have only one caregiver for the patient.  Other household members should stay in another home or place of residence. If this is not possible, they should stay  in another room, or be  separated from the patient as much as possible. Use a separate bathroom, if available.  Restrict visitors who do not have an essential need to  be in the home.  Keep older adults, very young children, and other sick people away from the patient Keep older adults, very young children, and those who have compromised immune systems or chronic health conditions away from the patient. This includes people with chronic heart, lung, or kidney conditions, diabetes, and cancer.  Ensure good ventilation Make sure that shared spaces in the home have good air flow, such as from an air conditioner or an opened window, weather permitting.  Wash your hands often  Wash your hands often and thoroughly with soap and water for at least 20 seconds. You can use an alcohol based hand sanitizer if soap and water are not available and if your hands are not visibly dirty.  Avoid touching your eyes, nose, and mouth with unwashed hands.  Use disposable paper towels to dry your hands. If not available, use dedicated cloth towels and replace them when they become wet.  Wear a facemask and gloves  Wear a disposable facemask at all times in the room and gloves when you touch or have contact with the patient's blood, body fluids, and/or secretions or excretions, such as sweat, saliva, sputum, nasal mucus, vomit, urine, or feces.  Ensure the mask fits over your nose and mouth tightly, and do not touch it during use.  Throw out disposable facemasks and gloves after using them. Do not reuse.  Wash your hands immediately after removing your facemask and gloves.  If your personal clothing becomes contaminated, carefully remove clothing and launder. Wash your hands after handling contaminated clothing.  Place all used disposable facemasks, gloves, and other waste in a lined container before disposing them with other household waste.  Remove gloves and wash your hands immediately after handling these items.  Do not share  dishes, glasses, or other household items with the patient  Avoid sharing household items. You should not share dishes, drinking glasses, cups, eating utensils, towels, bedding, or other items with a patient who is confirmed to have, or being evaluated for, COVID-19 infection.  After the person uses these items, you should wash them thoroughly with soap and water.  Wash laundry thoroughly  Immediately remove and wash clothes or bedding that have blood, body fluids, and/or secretions or excretions, such as sweat, saliva, sputum, nasal mucus, vomit, urine, or feces, on them.  Wear gloves when handling laundry from the patient.  Read and follow directions on labels of laundry or clothing items and detergent. In general, wash and dry with the warmest temperatures recommended on the label.  Clean all areas the individual has used often  Clean all touchable surfaces, such as counters, tabletops, doorknobs, bathroom fixtures, toilets, phones, keyboards, tablets, and bedside tables, every day. Also, clean any surfaces that may have blood, body fluids, and/or secretions or excretions on them.  Wear gloves when cleaning surfaces the patient has come in contact with.  Use a diluted bleach solution (e.g., dilute bleach with 1 part bleach and 10 parts water) or a household disinfectant with a label that says EPA-registered for coronaviruses. To make a bleach solution at home, add 1 tablespoon of bleach to 1 quart (4 cups) of water. For a larger supply, add  cup of bleach to 1 gallon (16 cups) of water.  Read labels of cleaning products and follow recommendations provided on product labels. Labels contain instructions for safe and effective use of the cleaning product including precautions you should take when applying the product, such as wearing gloves or eye protection and making sure  you have good ventilation during use of the product.  Remove gloves and wash hands immediately after  cleaning.  Monitor yourself for signs and symptoms of illness Caregivers and household members are considered close contacts, should monitor their health, and will be asked to limit movement outside of the home to the extent possible. Follow the monitoring steps for close contacts listed on the symptom monitoring form.   ? If you have additional questions, contact your local health department or call the epidemiologist on call at [phone] (available 24/7). ? This guidance is subject to change. For the most up-to-date guidance from [facility], please refer to their website: TripMetro.huhttps://www.cdc.gov/coronavirus/2019-ncov/hcp/guidance-prevent-spread.html    ED Prescriptions    None     PDMP not reviewed this encounter.   Lew DawesWieters, Sharlena Kristensen C, PA-C 10/25/19 1505

## 2019-10-27 LAB — NOVEL CORONAVIRUS, NAA (HOSP ORDER, SEND-OUT TO REF LAB; TAT 18-24 HRS): SARS-CoV-2, NAA: NOT DETECTED

## 2020-04-25 ENCOUNTER — Other Ambulatory Visit: Payer: Self-pay

## 2020-04-25 ENCOUNTER — Encounter (HOSPITAL_COMMUNITY): Payer: Self-pay

## 2020-04-25 ENCOUNTER — Ambulatory Visit (HOSPITAL_COMMUNITY)
Admission: EM | Admit: 2020-04-25 | Discharge: 2020-04-25 | Disposition: A | Discharge status: Home / Self Care | Payer: 59 | Attending: Family Medicine | Admitting: Family Medicine

## 2020-04-25 DIAGNOSIS — M940 Chondrocostal junction syndrome [Tietze]: Secondary | ICD-10-CM

## 2020-04-25 DIAGNOSIS — R0789 Other chest pain: Secondary | ICD-10-CM

## 2020-04-25 MED ORDER — NAPROXEN 500 MG PO TABS: 500.0000 mg | ORAL_TABLET | Freq: Two times a day (BID) | ORAL | 0 refills | Status: DC

## 2020-04-25 NOTE — ED Triage Notes (Addendum)
Pt presents to urgent care for palpitations since last night. Pt resting in treatment chair with even unlabored respirations, and noted to have pink nondiaphoretic skin. Pt denies n/v. Pt denies chest pain and denies pain radiating to arm or back. Pt states he was seen at [facility] for same last year and given meds but he is unsure of what they are, and not currently taking.

## 2020-04-25 NOTE — ED Notes (Signed)
EKG given to MD Hagler

## 2020-04-26 NOTE — ED Provider Notes (Signed)
[facility]   [id number] 04/25/20 Arrival Time: 1857  ASSESSMENT & PLAN:  1. Anterior chest wall pain   2. Costochondritis     Patient history and exam consistent with non-cardiac cause of chest pain. Conservative measures indicated.  ECG: Performed today and interpreted by me: normal EKG, normal sinus rhythm. No STEMI.  No indication for chest imaging.  Begin trial of: Meds ordered this encounter  Medications  . naproxen (NAPROSYN) 500 MG tablet    Sig: Take 1 tablet (500 mg total) by mouth 2 (two) times daily with a meal.    Dispense:  20 tablet    Refill:  0   Avoid heavy lifting.   Reviewed expectations re: course of current medical issues. Questions answered. Outlined signs and symptoms indicating need for more acute intervention. Patient verbalized understanding. After Visit Summary given.   SUBJECTIVE:  History from: patient. Brian Hester is a 44 y.o. male who presents with complaint of intermittent upper left chest pain. Questions palpitations last evening; none today. Pain described as an underlying ache with sharp pains upon certain movements that does not radiate. Onset gradual, first noted yesterday. Reports these symptoms are stable since beginning. Without associated n/v; without associated SOB. Injury or recent strenuous activity: none. Typical duration of symptoms when present: few seconds then eases. Recent illnesses: none. Fever: absent. Ambulatory without assistance. Self/OTC treatment: none. History of similar: "a little". Illicit drug use: none.  Social History   Tobacco Use  Smoking Status Never Smoker  Smokeless Tobacco Never Used   Social History   Substance and Sexual Activity  Alcohol Use Yes   Comment: socially     OBJECTIVE:  Vitals:   04/25/20 1904  BP: 128/84  Pulse: 67  Resp: 16  Temp: 98.6 F (37 C)  TempSrc: Oral  SpO2: 100%    General appearance: alert, oriented, no acute distress Eyes: PERRLA;  EOMI; conjunctivae normal HENT: normocephalic; atraumatic Neck: supple with FROM Lungs: without labored respirations; speaks full sentences without difficulty; CTAB Heart: regular rate and rhythm without murmer Chest Wall: with tenderness to palpation over upper left where he reports symptoms Abdomen: soft, non-tender; no guarding or rebound tenderness Extremities: without edema; without calf swelling or tenderness; symmetrical without gross deformities Skin: warm and dry; without rash or lesions Neuro: normal gait Psychological: alert and cooperative; normal mood and affect  Allergies  Allergen Reactions  . Promethazine Itching    Past Medical History:  Diagnosis Date  . TB (pulmonary tuberculosis)    Social History   Socioeconomic History  . Marital status: Married    Spouse name: Not on file  . Number of children: 5  . Years of education: Not on file  . Highest education level: Not on file  Occupational History  . Not on file  Tobacco Use  . Smoking status: Never Smoker  . Smokeless tobacco: Never Used  Vaping Use  . Vaping Use: Never used  Substance and Sexual Activity  . Alcohol use: Yes    Comment: socially  . Drug use: No  . Sexual activity: Yes    Partners: Female  Other Topics Concern  . Not on file  Social History Narrative  . Not on file   Social Determinants of Health   Financial Resource Strain:   . Difficulty of Paying Living Expenses:   Food Insecurity:   . Worried About Programme researcher, broadcasting/film/video in the Last Year:   . [employer] of Food in the Last Year:  Transportation Needs:   . Film/video editor (Medical):   Marland Kitchen Lack of Transportation (Non-Medical):   Physical Activity:   . Days of Exercise per Week:   . Minutes of Exercise per Session:   Stress:   . Feeling of Stress :   Social Connections:   . Frequency of Communication with Friends and Family:   . Frequency of Social Gatherings with Friends and Family:   . Attends Religious Services:     . Active Member of Clubs or Organizations:   . Attends Archivist Meetings:   Marland Kitchen Marital Status:   Intimate Partner Violence:   . Fear of Current or Ex-Partner:   . Emotionally Abused:   Marland Kitchen Physically Abused:   . Sexually Abused:    Family History  Problem Relation Age of Onset  . Diabetes Neg Hx   . Cancer Neg Hx    History reviewed. No pertinent surgical history.   Vanessa Kick, MD 04/26/20 1115

## 2020-05-15 ENCOUNTER — Other Ambulatory Visit: Payer: Self-pay

## 2020-05-15 ENCOUNTER — Ambulatory Visit (HOSPITAL_COMMUNITY)
Admission: EM | Admit: 2020-05-15 | Discharge: 2020-05-15 | Disposition: A | Discharge status: Home / Self Care | Payer: 59 | Attending: Family Medicine | Admitting: Family Medicine

## 2020-05-15 ENCOUNTER — Encounter (HOSPITAL_COMMUNITY): Payer: Self-pay

## 2020-05-15 DIAGNOSIS — M62830 Muscle spasm of back: Secondary | ICD-10-CM

## 2020-05-15 MED ORDER — IBUPROFEN 800 MG PO TABS: 800.0000 mg | ORAL_TABLET | Freq: Three times a day (TID) | ORAL | 0 refills | Status: DC

## 2020-05-15 MED ORDER — TIZANIDINE HCL 4 MG PO TABS: 4.0000 mg | ORAL_TABLET | Freq: Four times a day (QID) | ORAL | 0 refills | Status: DC | PRN

## 2020-05-15 NOTE — ED Provider Notes (Signed)
[facility]    CSN: [id number] Arrival date & time: 05/15/20  1021      History   Chief Complaint Chief Complaint  Patient presents with  . Flank Pain    HPI Brian Hester is a 44 y.o. male.   HPI  Patient states she did have much of bending and twisting while exercising.  He states that as he was doing this he felt the pain in his right low back.  He points to his right flank, the lumbar column of muscles.  He states it is tight and sore.  Worse with movement.  Better with rest.  He has not tried any ice or heat.  He has not tried any medication.  He wants medicine so he can go to work today.  Past Medical History:  Diagnosis Date  . TB (pulmonary tuberculosis)     Patient Active Problem List   Diagnosis Date Noted  . Encounter for medical examination to establish care 04/22/2017  . Neck pain on right side 04/22/2017  . Groin pain 04/22/2017  . Screening for HIV without presence of risk factors 04/22/2017    History reviewed. No pertinent surgical history.     Home Medications    Prior to Admission medications   Medication Sig Start Date End Date Taking? Authorizing Provider  acetaminophen (TYLENOL) 500 MG tablet Take 1,000 mg by mouth every 6 (six) hours as needed for mild pain.    [provider]  aspirin 81 MG tablet Take 162 mg by mouth daily.    [provider]  ibuprofen (ADVIL) 800 MG tablet Take 1 tablet (800 mg total) by mouth 3 (three) times daily. 05/15/20   Eustace Moore, MD  lisinopril (PRINIVIL,ZESTRIL) 5 MG tablet Take 1 tablet (5 mg total) by mouth daily for 10 days. 01/12/19 01/22/19  Hyman Hopes, Margaux, PA-C  tiZANidine (ZANAFLEX) 4 MG tablet Take 1-2 tablets (4-8 mg total) by mouth every 6 (six) hours as needed for muscle spasms. 05/15/20   Eustace Moore, MD    Family History Family History  Problem Relation Age of Onset  . Fibroids Mother   . Diabetes Neg Hx   . Cancer Neg Hx     Social History Social  History   Tobacco Use  . Smoking status: Never Smoker  . Smokeless tobacco: Never Used  Vaping Use  . Vaping Use: Never used  Substance Use Topics  . Alcohol use: Yes    Comment: socially  . Drug use: No     Allergies   Promethazine   Review of Systems Review of Systems Back pain See HPI  Physical Exam Triage Vital Signs ED Triage Vitals  Enc Vitals Group     BP 05/15/20 1047 111/79     Pulse Rate 05/15/20 1047 62     Resp 05/15/20 1047 18     Temp 05/15/20 1047 98.8 F (37.1 C)     Temp Source 05/15/20 1047 Oral     SpO2 05/15/20 1047 100 %     Weight --      Height --      Head Circumference --      Peak Flow --      Pain Score 05/15/20 1044 8     Pain Loc --      Pain Edu? --      Excl. in GC? --    No data found.  Updated Vital Signs BP 111/79 (BP Location: Right Arm)   Pulse  62   Temp 98.8 F (37.1 C) (Oral)   Resp 18   SpO2 100%     Physical Exam Constitutional:      General: He is not in acute distress.    Appearance: He is well-developed and normal weight.  HENT:     Head: Normocephalic and atraumatic.     Nose:     Comments: Mask in place Eyes:     Conjunctiva/sclera: Conjunctivae normal.     Pupils: Pupils are equal, round, and reactive to light.  Cardiovascular:     Rate and Rhythm: Normal rate.  Pulmonary:     Effort: Pulmonary effort is normal. No respiratory distress.  Abdominal:     General: There is no distension.     Palpations: Abdomen is soft.  Musculoskeletal:        General: Tenderness present. Normal range of motion.     Cervical back: Normal range of motion.     Comments: Tenderness in right lumbar muscles  Skin:    General: Skin is warm and dry.  Neurological:     General: No focal deficit present.     Mental Status: He is alert.     Motor: No weakness.     Gait: Gait normal.     Deep Tendon Reflexes: Reflexes normal.  Psychiatric:        Mood and Affect: Mood normal.        Behavior: Behavior normal.       UC Treatments / Results  Labs (all labs ordered are listed, but only abnormal results are displayed) Labs Reviewed - No data to display  EKG   Radiology No results found.  Procedures Procedures (including critical care time)  Medications Ordered in UC Medications - No data to display  Initial Impression / Assessment and Plan / UC Course  I have reviewed the triage vital signs and the nursing notes.  Pertinent labs & imaging results that were available during my care of the patient were reviewed by me and considered in my medical decision making (see chart for details).  Clinical Course as of May 15 1201  Mon May 15, 2020  1117 POCT Urinalysis, Dipstick [YN]    Clinical Course User Index [YN] Eustace Moore, MD    musc back pain Final Clinical Impressions(s) / UC Diagnoses   Final diagnoses:  Lumbar paraspinal muscle spasm     Discharge Instructions     Take ibuprofen 3 times a day This is for pain and inflammation Take tizanidine as needed for muscle relaxer Use ice or heat to area   ED Prescriptions    Medication Sig Dispense Auth. Provider   ibuprofen (ADVIL) 800 MG tablet Take 1 tablet (800 mg total) by mouth 3 (three) times daily. 21 tablet Eustace Moore, MD   tiZANidine (ZANAFLEX) 4 MG tablet Take 1-2 tablets (4-8 mg total) by mouth every 6 (six) hours as needed for muscle spasms. 21 tablet Eustace Moore, MD     PDMP not reviewed this encounter.   Eustace Moore, MD 05/15/20 1204

## 2020-05-15 NOTE — Discharge Instructions (Addendum)
Take ibuprofen 3 times a day This is for pain and inflammation Take tizanidine as needed for muscle relaxer Use ice or heat to area

## 2020-05-15 NOTE — ED Triage Notes (Signed)
Pt is here with right flank pain that started Saturday after exercising. Pt has taken Icy-Hot to relieve discomfort.

## 2020-06-13 ENCOUNTER — Ambulatory Visit (HOSPITAL_COMMUNITY)
Admission: EM | Admit: 2020-06-13 | Discharge: 2020-06-13 | Disposition: A | Discharge status: Home / Self Care | Payer: 59 | Attending: Family Medicine | Admitting: Family Medicine

## 2020-06-13 ENCOUNTER — Other Ambulatory Visit: Payer: Self-pay

## 2020-06-13 ENCOUNTER — Ambulatory Visit (INDEPENDENT_AMBULATORY_CARE_PROVIDER_SITE_OTHER): Payer: 59

## 2020-06-13 ENCOUNTER — Encounter (HOSPITAL_COMMUNITY): Payer: Self-pay | Admitting: Emergency Medicine

## 2020-06-13 DIAGNOSIS — R0781 Pleurodynia: Secondary | ICD-10-CM

## 2020-06-13 MED ORDER — IBUPROFEN 600 MG PO TABS: 600.0000 mg | ORAL_TABLET | Freq: Four times a day (QID) | ORAL | 0 refills | Status: DC | PRN

## 2020-06-13 NOTE — ED Triage Notes (Signed)
Pt presents to [facility] for assessment of right rib pain after slamming into his pool table coming home Saturday.  Patient c/o increased pain with inspiration

## 2020-06-13 NOTE — Discharge Instructions (Addendum)
Please try ice  Please try ibuprofen  Please follow up if your symptoms fail to improve.  
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
## 2020-09-22 ENCOUNTER — Other Ambulatory Visit: Payer: Self-pay

## 2020-09-22 ENCOUNTER — Ambulatory Visit (HOSPITAL_COMMUNITY)
Admission: EM | Admit: 2020-09-22 | Discharge: 2020-09-22 | Disposition: A | Discharge status: Home / Self Care | Payer: 59 | Attending: Family Medicine | Admitting: Family Medicine

## 2020-09-22 ENCOUNTER — Encounter (HOSPITAL_COMMUNITY): Payer: Self-pay | Admitting: Emergency Medicine

## 2020-09-22 DIAGNOSIS — M545 Low back pain, unspecified: Secondary | ICD-10-CM

## 2020-09-22 DIAGNOSIS — S00511A Abrasion of lip, initial encounter: Secondary | ICD-10-CM

## 2020-09-22 DIAGNOSIS — S161XXA Strain of muscle, fascia and tendon at neck level, initial encounter: Secondary | ICD-10-CM | POA: Diagnosis not present

## 2020-09-22 MED ORDER — CYCLOBENZAPRINE HCL 5 MG PO TABS: 5.0000 mg | ORAL_TABLET | Freq: Three times a day (TID) | ORAL | 0 refills | Status: DC | PRN

## 2020-09-22 MED ORDER — IBUPROFEN 600 MG PO TABS: 600.0000 mg | ORAL_TABLET | Freq: Three times a day (TID) | ORAL | 0 refills | Status: DC | PRN

## 2020-09-22 MED ORDER — BACITRACIN ZINC 500 UNIT/GM EX OINT: 1.0000 "application " | TOPICAL_OINTMENT | Freq: Two times a day (BID) | CUTANEOUS | 0 refills | Status: DC
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
## 2020-09-25 ENCOUNTER — Ambulatory Visit (HOSPITAL_COMMUNITY): Admission: EM | Admit: 2020-09-25 | Discharge: 2020-09-25 | Discharge status: Against Medical Advice | Payer: 59

## 2020-09-25 ENCOUNTER — Other Ambulatory Visit: Payer: Self-pay

## 2020-09-25 NOTE — ED Notes (Signed)
Patient left to go pick up daughter, will return later

## 2020-12-02 ENCOUNTER — Other Ambulatory Visit: Payer: Self-pay

## 2020-12-02 ENCOUNTER — Ambulatory Visit (HOSPITAL_COMMUNITY)
Admission: EM | Admit: 2020-12-02 | Discharge: 2020-12-02 | Disposition: A | Discharge status: Home / Self Care | Payer: BC Managed Care – PPO | Attending: Family Medicine | Admitting: Family Medicine

## 2020-12-02 ENCOUNTER — Encounter (HOSPITAL_COMMUNITY): Payer: Self-pay | Admitting: Emergency Medicine

## 2020-12-02 DIAGNOSIS — R079 Chest pain, unspecified: Secondary | ICD-10-CM | POA: Diagnosis not present

## 2020-12-02 MED ORDER — LIDOCAINE VISCOUS HCL 2 % MT SOLN
OROMUCOSAL | Status: AC
  Filled 2020-12-02: qty 15

## 2020-12-02 MED ORDER — ALUM & MAG HYDROXIDE-SIMETH 200-200-20 MG/5ML PO SUSP
30.0000 mL | Freq: Once | ORAL | Status: AC
  Administered 2020-12-02: 30 mL via ORAL

## 2020-12-02 MED ORDER — LIDOCAINE VISCOUS HCL 2 % MT SOLN
15.0000 mL | Freq: Once | OROMUCOSAL | Status: AC
  Administered 2020-12-02: 15 mL via ORAL

## 2020-12-02 MED ORDER — ALUM & MAG HYDROXIDE-SIMETH 200-200-20 MG/5ML PO SUSP
ORAL | Status: AC
  Filled 2020-12-02: qty 30

## 2020-12-02 NOTE — ED Provider Notes (Signed)
[facility]    CSN: [id number] Arrival date & time: 12/02/20  1644      History   Chief Complaint Chief Complaint  Patient presents with   Chest Pain   Headache    HPI Brian Hester is a 45 y.o. male.   HPI   Chest Pain: Pt states that he has had a burning type chest pain for the past day.  He states that the pain is substernal in nature and was rated an 8 out of 10 in nature before he got here and now is a 6 out of 10 in nature.  He has not had any pain like this before.  He has not tried anything for pain.  He does not believe food makes pain worse but thinks activity may make pain worse.  He also has some associated headache.  No history of cocaine use, recent illness, fever, GERD, costochondritis or cardiac history. He is a never smoker.   Past Medical History:  Diagnosis Date   TB (pulmonary tuberculosis)     Patient Active Problem List   Diagnosis Date Noted   Encounter for medical examination to establish care 04/22/2017   Neck pain on right side 04/22/2017   Groin pain 04/22/2017   Screening for HIV without presence of risk factors 04/22/2017    History reviewed. No pertinent surgical history.     Home Medications    Prior to Admission medications   Medication Sig Start Date End Date Taking? Authorizing Provider  bacitracin ointment Apply 1 application topically 2 (two) times daily. 09/22/20   Dahlia Byes A, NP  cyclobenzaprine (FLEXERIL) 5 MG tablet Take 1 tablet (5 mg total) by mouth 3 (three) times daily as needed for muscle spasms. 09/22/20   Dahlia Byes A, NP  ibuprofen (ADVIL) 600 MG tablet Take 1 tablet (600 mg total) by mouth every 8 (eight) hours as needed for moderate pain. 09/22/20   Dahlia Byes A, NP  lisinopril (PRINIVIL,ZESTRIL) 5 MG tablet Take 1 tablet (5 mg total) by mouth daily for 10 days. 01/12/19 01/22/19  Tanda Rockers, PA-C    Family History Family History  Problem Relation Age of Onset   Fibroids Mother     Diabetes Neg Hx    Cancer Neg Hx     Social History Social History   Tobacco Use   Smoking status: Never Smoker   Smokeless tobacco: Never Used  Building services engineer Use: Never used  Substance Use Topics   Alcohol use: Yes    Comment: socially   Drug use: No     Allergies   Promethazine   Review of Systems Review of Systems  As stated above in the HPI   Physical Exam Triage Vital Signs ED Triage Vitals  Enc Vitals Group     BP 12/02/20 1716 137/86     Pulse Rate 12/02/20 1716 61     Resp 12/02/20 1716 18     Temp 12/02/20 1716 98.2 F (36.8 C)     Temp Source 12/02/20 1716 Oral     SpO2 12/02/20 1716 98 %     Weight 12/02/20 1717 140 lb (63.5 kg)     Height 12/02/20 1717 5\' 8"  (1.727 m)     Head Circumference --      Peak Flow --      Pain Score 12/02/20 1717 9     Pain Loc --      Pain Edu? --  Excl. in GC? --    No data found.  Updated Vital Signs BP 137/86 (BP Location: Right Arm)    Pulse 61    Temp 98.2 F (36.8 C) (Oral)    Resp 18    Ht 5\' 8"  (1.727 m)    Wt 140 lb (63.5 kg)    SpO2 98%    BMI 21.29 kg/m   Physical Exam Vitals and nursing note reviewed.  Constitutional:      General: He is not in acute distress (sitting comfortably while on his phone).    Appearance: He is not ill-appearing, toxic-appearing or diaphoretic.  Eyes:     Comments: No evidence of pallor  Neck:     Thyroid: No thyromegaly.     Vascular: No JVD.  Cardiovascular:     Rate and Rhythm: Normal rate and regular rhythm.     Chest Wall: PMI is not displaced.     Heart sounds: Heart sounds not distant. No murmur heard.  No systolic murmur is present.  No diastolic murmur is present.   Pulmonary:     Effort: Pulmonary effort is normal. No tachypnea or respiratory distress.     Breath sounds: Normal breath sounds.  Chest:     Chest wall: No mass, deformity, tenderness, crepitus or edema.  Abdominal:     Palpations: Abdomen is soft.  Musculoskeletal:      Cervical back: Normal range of motion.     Right lower leg: No edema.     Left lower leg: No edema.  Lymphadenopathy:     Cervical: No cervical adenopathy.  Skin:    General: Skin is warm and dry.     Coloration: Skin is not cyanotic or pale.     Nails: There is no clubbing.  Neurological:     Mental Status: He is alert.      UC Treatments / Results  Labs (all labs ordered are listed, but only abnormal results are displayed) Labs Reviewed - No data to display  EKG   Radiology No results found.  Procedures Procedures (including critical care time)  Medications Ordered in UC Medications  alum & mag hydroxide-simeth (MAALOX/MYLANTA) 200-200-20 MG/5ML suspension 30 mL (has no administration in time range)    And  lidocaine (XYLOCAINE) 2 % viscous mouth solution 15 mL (has no administration in time range)    Initial Impression / Assessment and Plan / UC Course  I have reviewed the triage vital signs and the nursing notes.  Pertinent labs & imaging results that were available during my care of the patient were reviewed by me and considered in my medical decision making (see chart for details).     New.  Vital signs and exam are reassuring.  Attempting GI cocktail trial.  If symptoms improve this does not likely represent a cardiac event however if symptoms fail to improve I am going to recommend that he be evaluated in the emergency room. We discussed red flag signs and symptoms.   Update: Patient states her pain has fully resolved with GI cocktail.  We discussed that this likely represents acid reflux.  Discussed GERD diet.  Should pain return or occur with activity he will need to be reevaluated.  I have also recommended that he find a PCP for continued care.    Final Clinical Impressions(s) / UC Diagnoses   Final diagnoses:  None   Discharge Instructions   None    ED Prescriptions    None  PDMP not reviewed this encounter.   Rushie Chestnut,  [place] 12/02/20 1819

## 2020-12-02 NOTE — ED Triage Notes (Signed)
Pt presents today with c/o of mid chest pain described as burning, constant since yesterday. +headache

## 2020-12-02 NOTE — ED Triage Notes (Signed)
Pt declined offer for translator.

## 2020-12-25 ENCOUNTER — Other Ambulatory Visit: Payer: Self-pay

## 2020-12-25 ENCOUNTER — Ambulatory Visit: Payer: BC Managed Care – PPO | Attending: Internal Medicine | Admitting: Internal Medicine

## 2020-12-25 ENCOUNTER — Encounter: Payer: Self-pay | Admitting: Internal Medicine

## 2020-12-25 VITALS — BP 142/88 | HR 73 | Resp 16 | Ht 68.0 in | Wt 174.2 lb

## 2020-12-25 DIAGNOSIS — Z2821 Immunization not carried out because of patient refusal: Secondary | ICD-10-CM

## 2020-12-25 DIAGNOSIS — Z7689 Persons encountering health services in other specified circumstances: Secondary | ICD-10-CM

## 2020-12-25 DIAGNOSIS — R030 Elevated blood-pressure reading, without diagnosis of hypertension: Secondary | ICD-10-CM | POA: Diagnosis not present

## 2020-12-25 DIAGNOSIS — R0789 Other chest pain: Secondary | ICD-10-CM

## 2020-12-25 NOTE — Patient Instructions (Signed)

## 2020-12-25 NOTE — Progress Notes (Signed)
Patient ID: Brian Hester, male    DOB: October 14, 1976  MRN: [id number]  CC: Hospitalization Follow-up (ED)   Subjective: Brian Hester is a 45 y.o. male who presents for UC f/u and to establish care His concerns today include:   No previous PCP No chronic med conditions.  Patient was seen at urgent care on the 29th of last month with complaints of burning type chest pain x1 day.  Vital signs were stable.  They gave him a GI cocktail and pain resolved with that. Since then, patient has not had any further chest pains. He is very active.  He jogs for 1 hour every morning.  He does not notice any chest pains or significant shortness of breath with exertion.  He is a non-smoker.  No family history of heart disease.  Denies any heartburn symptoms.  He does not use any street drugs.  Blood pressure was elevated at that visit to urgent care and is elevated today.  No previous history of blood pressure issues. He denies any chronic headaches, dizziness, shortness of breath or lower extremity edema.  Past medical, surgical, social history and family history reviewed. Patient Active Problem List   Diagnosis Date Noted  . Influenza vaccine refused 12/25/2020  . Encounter for medical examination to establish care 04/22/2017  . Neck pain on right side 04/22/2017  . Groin pain 04/22/2017  . Screening for HIV without presence of risk factors 04/22/2017     Current Outpatient Medications on File Prior to Visit  Medication Sig Dispense Refill  . bacitracin ointment Apply 1 application topically 2 (two) times daily. (Patient not taking: Reported on 12/25/2020) 120 g 0  . ibuprofen (ADVIL) 600 MG tablet Take 1 tablet (600 mg total) by mouth every 8 (eight) hours as needed for moderate pain. (Patient not taking: Reported on 12/25/2020) 30 tablet 0  . lisinopril (PRINIVIL,ZESTRIL) 5 MG tablet Take 1 tablet (5 mg total) by mouth daily for 10 days. 10 tablet 0   No current facility-administered  medications on file prior to visit.    Allergies  Allergen Reactions  . Promethazine Itching    Social History   Socioeconomic History  . Marital status: Married    Spouse name: Not on file  . Number of children: 5  . Years of education: Not on file  . Highest education level: Not on file  Occupational History  . Not on file  Tobacco Use  . Smoking status: Never Smoker  . Smokeless tobacco: Never Used  Vaping Use  . Vaping Use: Never used  Substance and Sexual Activity  . Alcohol use: Yes    Comment: socially  . Drug use: No  . Sexual activity: Yes    Partners: Female    Birth control/protection: None    Comment: Married  Other Topics Concern  . Not on file  Social History Narrative  . Not on file   Social Determinants of Health   Financial Resource Strain: Not on file  Food Insecurity: Not on file  Transportation Needs: Not on file  Physical Activity: Not on file  Stress: Not on file  Social Connections: Not on file  Intimate Partner Violence: Not on file    Family History  Problem Relation Age of Onset  . Fibroids Mother   . Diabetes Neg Hx   . Cancer Neg Hx     No past surgical history on file.  ROS: Review of Systems Negative except as stated above  PHYSICAL EXAM: BP Marland Kitchen)  142/88   Pulse 73   Resp 16   Ht 5\' 8"  (1.727 m)   Wt 174 lb 3.2 oz (79 kg)   SpO2 98%   BMI 26.49 kg/m   Physical Exam  General appearance - alert, well appearing, male who appears younger than his stated age.  And in no distress Mental status - normal mood, behavior, speech, dress, motor activity, and thought processes Neck - supple, no significant adenopathy Chest - clear to auscultation, no wheezes, rales or rhonchi, symmetric air entry Heart - normal rate, regular rhythm, normal S1, S2, no murmurs, rubs, clicks or gallops Extremities - peripheral pulses normal, no pedal edema, no clubbing or cyanosis  CMP Latest Ref Rng & Units 01/12/2019 04/23/2017  Glucose 70 -  99 mg/dL 91 89  BUN 6 - 20 mg/dL 12 9  Creatinine 04/25/2017 - 1.24 mg/dL 6.28 3.15  Sodium 1.76 - 145 mmol/L 136 140  Potassium 3.5 - 5.1 mmol/L 3.4(L) 4.3  Chloride 98 - 111 mmol/L 103 101  CO2 22 - 32 mmol/L 24 23  Calcium 8.9 - 10.3 mg/dL 9.2 9.2   Lipid Panel  No results found for: CHOL, TRIG, HDL, CHOLHDL, VLDL, LDLCALC, LDLDIRECT  CBC    Component Value Date/Time   WBC 4.8 01/12/2019 1546   RBC 4.64 01/12/2019 1546   HGB 13.0 01/12/2019 1546   HGB 13.4 04/23/2017 0843   HCT 39.7 01/12/2019 1546   HCT 40.3 04/23/2017 0843   PLT 215 01/12/2019 1546   PLT 252 04/23/2017 0843   MCV 85.6 01/12/2019 1546   MCV 86 04/23/2017 0843   MCH 28.0 01/12/2019 1546   MCHC 32.7 01/12/2019 1546   RDW 12.5 01/12/2019 1546   RDW 14.3 04/23/2017 0843    ASSESSMENT AND PLAN: 1. Establishing care with new doctor, encounter for   2. Elevated blood pressure reading without diagnosis of hypertension DASH diet discussed and encouraged.  I will have him follow-up with the clinical pharmacist in about 1 month for repeat blood pressure check.  If still elevated I would start low-dose of amlodipine.  3. Atypical chest pain Resolved without recurrence.  Patient advised to follow-up if he has any further episodes.  4. Influenza vaccine refused Recommended.  Patient declines.    Patient was given the opportunity to ask questions.  Patient verbalized understanding of the plan and was able to repeat key elements of the plan.   No orders of the defined types were placed in this encounter.    Requested Prescriptions    No prescriptions requested or ordered in this encounter    Return for Give appt with [facility] in 1 mth for BP recheck.[facility], MD, FACP

## 2021-01-22 ENCOUNTER — Other Ambulatory Visit: Payer: Self-pay

## 2021-01-22 ENCOUNTER — Ambulatory Visit: Payer: BC Managed Care – PPO | Attending: Family Medicine | Admitting: Pharmacist

## 2021-01-22 ENCOUNTER — Encounter: Payer: Self-pay | Admitting: Pharmacist

## 2021-01-22 VITALS — BP 123/79

## 2021-01-22 DIAGNOSIS — R030 Elevated blood-pressure reading, without diagnosis of hypertension: Secondary | ICD-10-CM | POA: Diagnosis not present

## 2021-01-22 NOTE — Progress Notes (Signed)
   S:    PCP: Dr. Laural Benes   Patient arrives in good spirits. Presents to the clinic for hypertension evaluation, counseling, and management.  Patient was referred and last seen by Dr. Laural Benes on 12/25/2020. BP was 142/88 at this visit but no medication was started and counseled of lifestyle modifications. Presents today for follow-up and re check of BP.  Current BP Medications include: none  Antihypertensives tried in the past include: lisinopril 5 mg daily  Dietary habits include: tries to eat a lot of vegetables, staying away from salt as much as possible Exercise habits include: jogs 2-3 hours every morning Family History: none pertinent Alcohol: socially drinks Tobacco: never smoker  O:  Vitals:   01/22/21 0924  BP: 123/79   Home BP readings: none  Last 3 Office BP readings: BP Readings from Last 3 Encounters:  01/22/21 123/79  12/25/20 (!) 142/88  12/02/20 137/86   BMET    Component Value Date/Time   NA 136 01/12/2019 1546   NA 140 04/23/2017 0843   K 3.4 (L) 01/12/2019 1546   CL 103 01/12/2019 1546   CO2 24 01/12/2019 1546   GLUCOSE 91 01/12/2019 1546   BUN 12 01/12/2019 1546   BUN 9 04/23/2017 0843   CREATININE 0.97 01/12/2019 1546   CALCIUM 9.2 01/12/2019 1546   GFRNONAA >60 01/12/2019 1546   GFRAA >60 01/12/2019 1546   Renal function: CrCl cannot be calculated (Patient's most recent lab result is older than the maximum 21 days allowed.).  Clinical ASCVD: No  The ASCVD Risk score Denman George DC Jr., et al., 2013) failed to calculate for the following reasons:   Cannot find a previous HDL lab   Cannot find a previous total cholesterol lab  A/P: HTN undiagnosed, however patient had elevated BP reading at most recent PCP appointment. Referred for 1 month BP check today which was at goal so no therapy indicated at this time. Attributed decrease in BP to better dietary habits and increased exercise. -Counseled on lifestyle modifications for blood pressure control  including reduced dietary sodium, increased exercise, adequate sleep.  Results reviewed and written information provided. Total time in face-to-face counseling 15 minutes.    Patient seen with: Paulino Door, PharmD Candidate [employer]  Butch Penny, PharmD, [place], CPP Clinical Pharmacist [facility] [phone]

## 2021-12-12 ENCOUNTER — Ambulatory Visit (HOSPITAL_COMMUNITY)
Admission: EM | Admit: 2021-12-12 | Discharge: 2021-12-12 | Disposition: A | Discharge status: Home / Self Care | Payer: BC Managed Care – PPO | Attending: Emergency Medicine | Admitting: Emergency Medicine

## 2021-12-12 ENCOUNTER — Other Ambulatory Visit: Payer: Self-pay

## 2021-12-12 ENCOUNTER — Encounter (HOSPITAL_COMMUNITY): Payer: Self-pay | Admitting: *Deleted

## 2021-12-12 DIAGNOSIS — K529 Noninfective gastroenteritis and colitis, unspecified: Secondary | ICD-10-CM

## 2021-12-12 MED ORDER — ONDANSETRON HCL 4 MG PO TABS: 4.0000 mg | ORAL_TABLET | Freq: Three times a day (TID) | ORAL | 0 refills | Status: AC | PRN

## 2021-12-12 NOTE — ED Triage Notes (Signed)
T reports ABD pain ,diarrhea and fever.

## 2021-12-12 NOTE — Discharge Instructions (Addendum)
Use the Imodium per package directions.  Use the prescription nausea medicine if needed. Make sure to drink plenty of liquids to stay hydrated.

## 2021-12-12 NOTE — ED Provider Notes (Signed)
[facility]    CSN: [id number] Arrival date & time: 12/12/21  1725      History   Chief Complaint Chief Complaint  Patient presents with   Abdominal Pain   Diarrhea   Fever    HPI Brian Hester is a 46 y.o. male.  Patient began feeling ill yesterday evening and vomited once.  This morning he woke up with frequent liquid diarrhea.  Reports abdominal pain, chills.  Denies any other symptoms.  Tried Imodium today for 1 dose and has had 1 episode of diarrhea since then; he thinks the diarrhea episode after Imodium was caused by eating beef and rice.   Abdominal Pain Associated symptoms: chills, diarrhea, fever, nausea and vomiting   Associated symptoms: no sore throat   Diarrhea Associated symptoms: abdominal pain, chills, fever and vomiting   Fever Associated symptoms: chills, diarrhea, nausea and vomiting   Associated symptoms: no congestion and no sore throat    Past Medical History:  Diagnosis Date   TB (pulmonary tuberculosis)     Patient Active Problem List   Diagnosis Date Noted   Influenza vaccine refused 12/25/2020   Elevated blood pressure reading without diagnosis of hypertension 12/25/2020   Encounter for medical examination to establish care 04/22/2017   Neck pain on right side 04/22/2017   Groin pain 04/22/2017   Screening for HIV without presence of risk factors 04/22/2017    History reviewed. No pertinent surgical history.     Home Medications    Prior to Admission medications   Medication Sig Start Date End Date Taking? Authorizing Provider  ondansetron (ZOFRAN) 4 MG tablet Take 1 tablet (4 mg total) by mouth every 8 (eight) hours as needed for nausea or vomiting. 12/12/21  Yes Cathlyn Parsons, NP    Family History Family History  Problem Relation Age of Onset   Fibroids Mother    Diabetes Neg Hx    Cancer Neg Hx     Social History Social History   Tobacco Use   Smoking status: Never   Smokeless tobacco: Never  Vaping Use    Vaping Use: Never used  Substance Use Topics   Alcohol use: Yes    Comment: socially   Drug use: No     Allergies   Promethazine   Review of Systems Review of Systems  Constitutional:  Positive for chills and fever.  HENT:  Negative for congestion and sore throat.   Gastrointestinal:  Positive for abdominal pain, diarrhea, nausea and vomiting.    Physical Exam Triage Vital Signs ED Triage Vitals  Enc Vitals Group     BP 12/12/21 1850 116/72     Pulse Rate 12/12/21 1850 75     Resp 12/12/21 1850 18     Temp 12/12/21 1850 99.4 F (37.4 C)     Temp src --      SpO2 12/12/21 1850 100 %     Weight --      Height --      Head Circumference --      Peak Flow --      Pain Score 12/12/21 1848 8     Pain Loc --      Pain Edu? --      Excl. in GC? --    No data found.  Updated Vital Signs BP 116/72    Pulse 75    Temp 99.4 F (37.4 C)    Resp 18    SpO2 100%   Visual Acuity Right  Eye Distance:   Left Eye Distance:   Bilateral Distance:    Right Eye Near:   Left Eye Near:    Bilateral Near:     Physical Exam Constitutional:      Appearance: He is well-developed. He is not ill-appearing.  Cardiovascular:     Rate and Rhythm: Normal rate and regular rhythm.  Pulmonary:     Effort: Pulmonary effort is normal.     Breath sounds: Normal breath sounds.  Abdominal:     General: Abdomen is flat. Bowel sounds are normal. There is no distension.     Tenderness: There is generalized abdominal tenderness. There is no guarding or rebound.  Neurological:     Mental Status: He is alert.     UC Treatments / Results  Labs (all labs ordered are listed, but only abnormal results are displayed) Labs Reviewed - No data to display  EKG   Radiology No results found.  Procedures Procedures (including critical care time)  Medications Ordered in UC Medications - No data to display  Initial Impression / Assessment and Plan / UC Course  I have reviewed the triage  vital signs and the nursing notes.  Pertinent labs & imaging results that were available during my care of the patient were reviewed by me and considered in my medical decision making (see chart for details).    Rx zofran. Reviewed diet for diarrhea. Given work note. Pt to continue imodium.   Final Clinical Impressions(s) / UC Diagnoses   Final diagnoses:  Gastroenteritis     Discharge Instructions      Use the Imodium per package directions.  Use the prescription nausea medicine if needed. Make sure to drink plenty of liquids to stay hydrated.    ED Prescriptions     Medication Sig Dispense Auth. Provider   ondansetron (ZOFRAN) 4 MG tablet Take 1 tablet (4 mg total) by mouth every 8 (eight) hours as needed for nausea or vomiting. 20 tablet Cathlyn Parsons, NP      PDMP not reviewed this encounter.   Cathlyn Parsons, NP 12/12/21 [phone]

## 2022-04-27 ENCOUNTER — Emergency Department (HOSPITAL_COMMUNITY)
Admission: EM | Admit: 2022-04-27 | Discharge: 2022-04-27 | Disposition: A | Discharge status: Home / Self Care | Payer: BC Managed Care – PPO | Attending: Emergency Medicine | Admitting: Emergency Medicine

## 2022-04-27 ENCOUNTER — Emergency Department (HOSPITAL_COMMUNITY): Payer: BC Managed Care – PPO

## 2022-04-27 ENCOUNTER — Ambulatory Visit (HOSPITAL_COMMUNITY)
Admission: EM | Admit: 2022-04-27 | Discharge: 2022-04-27 | Disposition: A | Discharge status: Home / Self Care | Payer: BC Managed Care – PPO | Attending: Internal Medicine | Admitting: Internal Medicine

## 2022-04-27 ENCOUNTER — Encounter (HOSPITAL_COMMUNITY): Payer: Self-pay

## 2022-04-27 ENCOUNTER — Other Ambulatory Visit: Payer: Self-pay

## 2022-04-27 ENCOUNTER — Encounter (HOSPITAL_COMMUNITY): Payer: Self-pay | Admitting: Emergency Medicine

## 2022-04-27 DIAGNOSIS — R519 Headache, unspecified: Secondary | ICD-10-CM | POA: Diagnosis not present

## 2022-04-27 DIAGNOSIS — F0781 Postconcussional syndrome: Secondary | ICD-10-CM | POA: Diagnosis not present

## 2022-04-27 DIAGNOSIS — R55 Syncope and collapse: Secondary | ICD-10-CM

## 2022-04-27 LAB — CBC WITH DIFFERENTIAL/PLATELET
Abs Immature Granulocytes: 0.01 10*3/uL (ref 0.00–0.07)
Basophils Absolute: 0 10*3/uL (ref 0.0–0.1)
Basophils Relative: 0 %
Eosinophils Absolute: 0.2 10*3/uL (ref 0.0–0.5)
Eosinophils Relative: 5 %
HCT: 43.6 % (ref 39.0–52.0)
Hemoglobin: 14.2 g/dL (ref 13.0–17.0)
Immature Granulocytes: 0 %
Lymphocytes Relative: 46 %
Lymphs Abs: 2.1 10*3/uL (ref 0.7–4.0)
MCH: 28.5 pg (ref 26.0–34.0)
MCHC: 32.6 g/dL (ref 30.0–36.0)
MCV: 87.6 fL (ref 80.0–100.0)
Monocytes Absolute: 0.4 10*3/uL (ref 0.1–1.0)
Monocytes Relative: 10 %
Neutro Abs: 1.8 10*3/uL (ref 1.7–7.7)
Neutrophils Relative %: 39 %
Platelets: 226 10*3/uL (ref 150–400)
RBC: 4.98 MIL/uL (ref 4.22–5.81)
RDW: 13 % (ref 11.5–15.5)
WBC: 4.5 10*3/uL (ref 4.0–10.5)
nRBC: 0 % (ref 0.0–0.2)

## 2022-04-27 LAB — BASIC METABOLIC PANEL
Anion gap: 9 (ref 5–15)
BUN: 9 mg/dL (ref 6–20)
CO2: 25 mmol/L (ref 22–32)
Calcium: 9.5 mg/dL (ref 8.9–10.3)
Chloride: 105 mmol/L (ref 98–111)
Creatinine, Ser: 0.94 mg/dL (ref 0.61–1.24)
GFR, Estimated: >60 mL/min (ref >60)
Glucose, Bld: 92 mg/dL (ref 70–99)
Potassium: 4.2 mmol/L (ref 3.5–5.1)
Sodium: 139 mmol/L (ref 135–145)

## 2022-04-27 MED ORDER — ACETAMINOPHEN 325 MG PO TABS
ORAL_TABLET | ORAL | Status: AC
  Filled 2022-04-27: qty 3

## 2022-04-27 MED ORDER — ACETAMINOPHEN 325 MG PO TABS
975.0000 mg | ORAL_TABLET | Freq: Once | ORAL | Status: AC
  Administered 2022-04-27: 975 mg via ORAL

## 2022-04-27 MED ORDER — METOCLOPRAMIDE HCL 5 MG/ML IJ SOLN
10.0000 mg | Freq: Once | INTRAMUSCULAR | Status: AC
  Administered 2022-04-27: 10 mg via INTRAVENOUS
  Filled 2022-04-27: qty 2

## 2022-04-27 MED ORDER — KETOROLAC TROMETHAMINE 15 MG/ML IJ SOLN
15.0000 mg | Freq: Once | INTRAMUSCULAR | Status: AC
  Administered 2022-04-27: 15 mg via INTRAVENOUS
  Filled 2022-04-27: qty 1
